# Patient Record
Sex: Female | Born: 1990 | Race: Black or African American | Hispanic: No | Marital: Single | Smoking: Current some day smoker
Health system: Southern US, Community
[De-identification: ages and names within clinical notes are randomized; demographics above are authoritative.]

## PROBLEM LIST (undated history)

## (undated) ENCOUNTER — Inpatient Hospital Stay (HOSPITAL_COMMUNITY): Payer: Self-pay

## (undated) DIAGNOSIS — F32A Depression, unspecified: Secondary | ICD-10-CM

## (undated) DIAGNOSIS — F329 Major depressive disorder, single episode, unspecified: Secondary | ICD-10-CM

## (undated) DIAGNOSIS — O24419 Gestational diabetes mellitus in pregnancy, unspecified control: Secondary | ICD-10-CM

## (undated) DIAGNOSIS — R51 Headache: Secondary | ICD-10-CM

## (undated) HISTORY — PX: NO PAST SURGERIES: SHX2092

---
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
## 2011-01-08 ENCOUNTER — Emergency Department (HOSPITAL_BASED_OUTPATIENT_CLINIC_OR_DEPARTMENT_OTHER)
Admission: EM | Admit: 2011-01-08 | Discharge: 2011-01-08 | Disposition: A | Discharge status: Home / Self Care | Payer: Self-pay | Attending: Emergency Medicine | Admitting: Emergency Medicine

## 2011-01-08 ENCOUNTER — Encounter: Payer: Self-pay | Admitting: Radiology

## 2011-01-08 ENCOUNTER — Emergency Department (INDEPENDENT_AMBULATORY_CARE_PROVIDER_SITE_OTHER): Payer: Self-pay

## 2011-01-08 DIAGNOSIS — T148XXA Other injury of unspecified body region, initial encounter: Secondary | ICD-10-CM | POA: Insufficient documentation

## 2011-01-08 DIAGNOSIS — Y92410 Unspecified street and highway as the place of occurrence of the external cause: Secondary | ICD-10-CM | POA: Insufficient documentation

## 2011-01-08 DIAGNOSIS — R0789 Other chest pain: Secondary | ICD-10-CM | POA: Insufficient documentation

## 2011-01-08 DIAGNOSIS — R0602 Shortness of breath: Secondary | ICD-10-CM | POA: Insufficient documentation

## 2011-01-08 DIAGNOSIS — T07XXXA Unspecified multiple injuries, initial encounter: Secondary | ICD-10-CM

## 2011-01-08 DIAGNOSIS — S21109A Unspecified open wound of unspecified front wall of thorax without penetration into thoracic cavity, initial encounter: Secondary | ICD-10-CM

## 2011-01-08 DIAGNOSIS — W269XXA Contact with unspecified sharp object(s), initial encounter: Secondary | ICD-10-CM

## 2011-01-08 MED ORDER — IBUPROFEN 800 MG PO TABS: 800.0000 mg | ORAL_TABLET | Freq: Three times a day (TID) | ORAL | Status: AC

## 2011-01-08 MED ORDER — IBUPROFEN 800 MG PO TABS
800.0000 mg | ORAL_TABLET | Freq: Once | ORAL | Status: AC
  Administered 2011-01-08: 800 mg via ORAL
  Filled 2011-01-08: qty 1
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
## 2013-08-17 ENCOUNTER — Inpatient Hospital Stay (HOSPITAL_COMMUNITY)
Admission: EM | Admit: 2013-08-17 | Discharge: 2013-08-22 | DRG: 781 | Disposition: A | Discharge status: Home / Self Care | Payer: Federal, State, Local not specified - Other | Source: Intra-hospital | Attending: Psychiatry | Admitting: Psychiatry

## 2013-08-17 ENCOUNTER — Inpatient Hospital Stay (HOSPITAL_COMMUNITY)
Admission: AD | Admit: 2013-08-17 | Discharge: 2013-08-17 | Disposition: A | Discharge status: Other Acute Inpatient Hospital | Payer: Medicaid Other | Source: Ambulatory Visit | Attending: Family Medicine | Admitting: Family Medicine

## 2013-08-17 ENCOUNTER — Encounter (HOSPITAL_COMMUNITY): Payer: Self-pay

## 2013-08-17 ENCOUNTER — Encounter (HOSPITAL_COMMUNITY): Payer: Self-pay | Admitting: *Deleted

## 2013-08-17 DIAGNOSIS — Z598 Other problems related to housing and economic circumstances: Secondary | ICD-10-CM

## 2013-08-17 DIAGNOSIS — F1290 Cannabis use, unspecified, uncomplicated: Secondary | ICD-10-CM

## 2013-08-17 DIAGNOSIS — Z5987 Material hardship due to limited financial resources, not elsewhere classified: Secondary | ICD-10-CM

## 2013-08-17 DIAGNOSIS — F329 Major depressive disorder, single episode, unspecified: Secondary | ICD-10-CM | POA: Diagnosis present

## 2013-08-17 DIAGNOSIS — Z681 Body mass index (BMI) 19 or less, adult: Secondary | ICD-10-CM

## 2013-08-17 DIAGNOSIS — O99340 Other mental disorders complicating pregnancy, unspecified trimester: Secondary | ICD-10-CM | POA: Insufficient documentation

## 2013-08-17 DIAGNOSIS — F1210 Cannabis abuse, uncomplicated: Secondary | ICD-10-CM | POA: Diagnosis present

## 2013-08-17 DIAGNOSIS — B9689 Other specified bacterial agents as the cause of diseases classified elsewhere: Secondary | ICD-10-CM | POA: Insufficient documentation

## 2013-08-17 DIAGNOSIS — A499 Bacterial infection, unspecified: Secondary | ICD-10-CM | POA: Insufficient documentation

## 2013-08-17 DIAGNOSIS — H53149 Visual discomfort, unspecified: Secondary | ICD-10-CM | POA: Insufficient documentation

## 2013-08-17 DIAGNOSIS — O2390 Unspecified genitourinary tract infection in pregnancy, unspecified trimester: Secondary | ICD-10-CM | POA: Insufficient documentation

## 2013-08-17 DIAGNOSIS — G43909 Migraine, unspecified, not intractable, without status migrainosus: Secondary | ICD-10-CM

## 2013-08-17 DIAGNOSIS — F17200 Nicotine dependence, unspecified, uncomplicated: Secondary | ICD-10-CM

## 2013-08-17 DIAGNOSIS — E860 Dehydration: Secondary | ICD-10-CM | POA: Insufficient documentation

## 2013-08-17 DIAGNOSIS — Z3490 Encounter for supervision of normal pregnancy, unspecified, unspecified trimester: Secondary | ICD-10-CM

## 2013-08-17 DIAGNOSIS — R45851 Suicidal ideations: Secondary | ICD-10-CM | POA: Insufficient documentation

## 2013-08-17 DIAGNOSIS — N760 Acute vaginitis: Secondary | ICD-10-CM | POA: Insufficient documentation

## 2013-08-17 DIAGNOSIS — F332 Major depressive disorder, recurrent severe without psychotic features: Secondary | ICD-10-CM | POA: Diagnosis present

## 2013-08-17 DIAGNOSIS — O99330 Smoking (tobacco) complicating pregnancy, unspecified trimester: Secondary | ICD-10-CM | POA: Insufficient documentation

## 2013-08-17 HISTORY — DX: Gestational diabetes mellitus in pregnancy, unspecified control: O24.419

## 2013-08-17 HISTORY — DX: Major depressive disorder, single episode, unspecified: F32.9

## 2013-08-17 HISTORY — DX: Depression, unspecified: F32.A

## 2013-08-17 HISTORY — DX: Headache: R51

## 2013-08-17 LAB — URINALYSIS, ROUTINE W REFLEX MICROSCOPIC
Bilirubin Urine: NEGATIVE
Glucose, UA: NEGATIVE mg/dL
HGB URINE DIPSTICK: NEGATIVE
Ketones, ur: 15 mg/dL — AB
Leukocytes, UA: NEGATIVE
NITRITE: NEGATIVE
PH: 7.5 (ref 5.0–8.0)
Protein, ur: NEGATIVE mg/dL
SPECIFIC GRAVITY, URINE: 1.015 (ref 1.005–1.030)
UROBILINOGEN UA: 0.2 mg/dL (ref 0.0–1.0)

## 2013-08-17 LAB — WET PREP, GENITAL
Trich, Wet Prep: NONE SEEN
Yeast Wet Prep HPF POC: NONE SEEN

## 2013-08-17 LAB — RAPID URINE DRUG SCREEN, HOSP PERFORMED
Amphetamines: NOT DETECTED
Barbiturates: NOT DETECTED
Benzodiazepines: NOT DETECTED
Cocaine: NOT DETECTED
Opiates: NOT DETECTED
Tetrahydrocannabinol: POSITIVE — AB

## 2013-08-17 LAB — OB RESULTS CONSOLE GC/CHLAMYDIA
Chlamydia: NEGATIVE
Gonorrhea: NEGATIVE

## 2013-08-17 MED ORDER — ACETAMINOPHEN 325 MG PO TABS
650.0000 mg | ORAL_TABLET | Freq: Four times a day (QID) | ORAL | Status: DC | PRN
  Administered 2013-08-19 (×2): 650 mg via ORAL
  Filled 2013-08-17 (×3): qty 2

## 2013-08-17 MED ORDER — OXYCODONE-ACETAMINOPHEN 5-325 MG PO TABS
1.0000 | ORAL_TABLET | Freq: Once | ORAL | Status: AC
  Administered 2013-08-17: 1 via ORAL
  Filled 2013-08-17: qty 1

## 2013-08-17 MED ORDER — MAGNESIUM HYDROXIDE 400 MG/5ML PO SUSP: 30.0000 mL | Freq: Every day | ORAL | Status: DC | PRN

## 2013-08-17 MED ORDER — PROMETHAZINE HCL 25 MG PO TABS
25.0000 mg | ORAL_TABLET | Freq: Once | ORAL | Status: AC
  Administered 2013-08-17: 25 mg via ORAL
  Filled 2013-08-17: qty 1

## 2013-08-17 MED ORDER — ALUM & MAG HYDROXIDE-SIMETH 200-200-20 MG/5ML PO SUSP
30.0000 mL | ORAL | Status: DC | PRN
  Administered 2013-08-20: 30 mL via ORAL

## 2013-08-17 NOTE — Progress Notes (Signed)
[facility] Group Notes:  (Nursing/MHT/Case Management/Adjunct)  Date:  08/17/2013  Time:  11:34 PM  Type of Therapy:  Group Therapy  Participation Level:  Did Not Attend  Participation Quality:  Did Not Attend  Affect:  Did Not Attend  Cognitive:  Did Not Attend  Insight:  None  Engagement in Group:  Did Not Attend  Modes of Intervention:  Socialization and Support  Summary of Progress/Problems: Pt. Was resting in bed.  Sondra ComeWilson, Kavish Lafitte J 08/17/2013, 11:34 PM
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
## 2013-08-17 NOTE — MAU Note (Signed)
[facility] arrived on campus to transport pt. Vital signs stable and pt ambulated to transport vehicle. [facility]. Awaiting call back.
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
## 2013-08-17 NOTE — Progress Notes (Signed)
D: Pt denies SI this evening. Pt presents with flat affect, depressed mood and brief eye contact. Pt observed in the bed lying down. Pt refused to attend wrap up group tonight. Pt repeatedly stated, "I'm fine, leave me alone, I just want to go to sleep". A: Medications reviewed by Clinical research associatewriter. Verbal support given. Pt encouraged to attend groups. 15 minute checks performed for safety. R: Pt guarded and forwards little information. Pt has minimal interaction on the milieu. Pt safety maintained.

## 2013-08-17 NOTE — Tx Team (Signed)
Initial Interdisciplinary Treatment Plan  PATIENT STRENGTHS: (choose at least two) Ability for insight Average or above average intelligence Capable of independent living General fund of knowledge  PATIENT STRESSORS: Financial difficulties Marital or family conflict   PROBLEM LIST: Problem List/Patient Goals Date to be addressed Date deferred Reason deferred Estimated date of resolution  Depression 08/17/13                                                      DISCHARGE CRITERIA:  Ability to meet basic life and health needs Improved stabilization in mood, thinking, and/or behavior Verbal commitment to aftercare and medication compliance  PRELIMINARY DISCHARGE PLAN: Attend aftercare/continuing care group Return to previous living arrangement  PATIENT/FAMIILY INVOLVEMENT: This treatment plan has been presented to and reviewed with the patient, Shelby Bailey, and/or family member, .  The patient and family have been given the opportunity to ask questions and make suggestions.  Hermela Hardt, [place] 08/17/2013, 7:23 PM

## 2013-08-17 NOTE — MAU Note (Signed)
Telepysch assessment completed. Pt cooperative and willing to answer questions. [facility] care of Dr. Dub MikesLugo. Will go to room 501-2. Consent signed and witnessed.

## 2013-08-17 NOTE — MAU Note (Signed)
[facility] called to transport patient 
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
## 2013-08-17 NOTE — MAU Note (Signed)
Also has felt dizzy when standing for a period of time

## 2013-08-17 NOTE — MAU Note (Signed)
SW called to see pt. Message left requesting consult

## 2013-08-18 ENCOUNTER — Encounter (HOSPITAL_COMMUNITY): Payer: Self-pay | Admitting: Psychiatry

## 2013-08-18 LAB — GC/CHLAMYDIA PROBE AMP
CT Probe RNA: NEGATIVE
GC PROBE AMP APTIMA: NEGATIVE

## 2013-08-18 NOTE — [facility] Suicide Risk Assessment (Signed)
Suicide Risk Assessment  Admission Assessment     Nursing information obtained from:    Demographic factors:    Current Mental Status:    Loss Factors:    Historical Factors:    Risk Reduction Factors:    Total Time spent with patient: 45 minutes  CLINICAL FACTORS:   Depression:   Hopelessness  Psychiatric Specialty Exam:     Blood pressure 105/73, pulse 118, temperature 99.1 F (37.3 C), temperature source Oral, resp. rate 21, height 5\' 6"  (1.676 m), weight 50.349 kg (111 lb), last menstrual period 05/03/2013.Body mass index is 17.92 kg/(m^2).  General Appearance: Fairly Groomed  Patent attorneyye Contact::  Fair  Speech:  Clear and Coherent  Volume:  fluctuates  Mood:  Anxious, Depressed, Irritable and worried  Affect:  anxious, worried  Thought Process:  Coherent and Goal Directed  Orientation:  Full (Time, Place, and Person)  Thought Content:  symtpoms, worries, concerns, not wanting to have this baby but at this stage of her pregnancy not able to have an abortion, not wanting to give the baby up for adoption  Suicidal Thoughts:  Yes.  without intent/plan  Homicidal Thoughts:  No  Memory:  Immediate;   Fair Recent;   Fair Remote;   Fair  Judgement:  Fair  Insight:  Present  Psychomotor Activity:  Restlessness  Concentration:  Fair  Recall:  FiservFair  Fund of Knowledge:Fair  Language: Fair  Akathisia:  No  Handed:    AIMS (if indicated):     Assets:  Resilience  Sleep:  Number of Hours: 6.25   Musculoskeletal: Strength & Muscle Tone: within normal limits Gait & Station: normal Patient leans: N/A  COGNITIVE FEATURES THAT CONTRIBUTE TO RISK:  Closed-mindedness Polarized thinking Thought constriction (tunnel vision)    SUICIDE RISK:   Moderate:  Frequent suicidal ideation with limited intensity, and duration, some specificity in terms of plans, no associated intent, good self-control, limited dysphoria/symptomatology, some risk factors present, and identifiable protective  factors, including available and accessible social support.  PLAN OF CARE: Supportive approach/coping skills                               CBT;mindfulness                                Reassess for need for antidepressants  I certify that inpatient services furnished can reasonably be expected to improve the patient's condition.  Clarann Helvey A 08/18/2013, 2:02 PM

## 2013-08-18 NOTE — Progress Notes (Signed)
Psychoeducational Group Note  Date: 08/18/2013 Time:  1015  Group Topic/Focus:  Identifying Needs:   The focus of this group is to help patients identify their personal needs that have been historically problematic and identify healthy behaviors to address their needs.  Participation Level:  Active  Participation Quality:  Appropriate  Affect:  Appropriate  Cognitive:  Oriented  Insight:  Improving  Engagement in Group:  Engaged  Additional Comments:  Attended and participated in the group  Wana Mount A  

## 2013-08-18 NOTE — Progress Notes (Signed)
D: Pt mood is depressed but she brightens upon approach. Eye contact is brief. She rates her depressed a 6/10. She states that her nausea from earlier today has improved.  A: Support given. Verbalization encouraged. Pt encouraged to come to staff with any concerns. Medications given as ordered. R: Pt is responsive. No complaints of pain or discomfort at this time. Q15 min safety checks maintained. Will continue to monitor pt.
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
## 2013-08-18 NOTE — [facility] Group Notes (Signed)
[facility] Group Notes: (Clinical Social Work)   08/18/2013      Type of Therapy:  Group Therapy   Participation Level:  Did Not Attend    Sanjith Siwek Grossman-Orr, LCSW 08/18/2013, 3:21 PM     

## 2013-08-18 NOTE — Progress Notes (Signed)
Adult Psychoeducational Group Note  Date:  08/18/2013 Time:  9:16 PM  Group Topic/Focus:  Wrap-Up Group:   The focus of this group is to help patients review their daily goal of treatment and discuss progress on daily workbooks.  Participation Level:  Active  Participation Quality:  Appropriate  Affect:  Appropriate  Cognitive:  Appropriate  Insight: Appropriate  Engagement in Group:  Engaged  Modes of Intervention:  Discussion  Additional Comments: The patient did not attended earlier groups.The patient was able to talk in talk base on other patient  comments.  Octavio Mannshigpen, Makaiah Terwilliger Lee 08/18/2013, 9:16 PM

## 2013-08-18 NOTE — Progress Notes (Signed)
.  Psychoeducational Group Note    Date: 08/18/2013 Time:  0930   Goal Setting Purpose of Group: To be able to set a goal that is measurable and that can be accomplished in one day Participation Level:  Active  Participation Quality:  Appropriate  Affect:  Appropriate  Cognitive:  Oriented  Insight:  Improving  Engagement in Group:  Improving  Additional Comments:    Yuritza Paulhus A  

## 2013-08-18 NOTE — Progress Notes (Signed)
[name] is seen OOB UAL on the 500 hall today...tolerated afiar. She is pleasant, she cooperates with her poc, she attends her groups and she is engaged in the group discussions. \  A She attends her groups, demonstrates minimal insight as evidenced by her  loud laughing and joking she has displayed both during groups and throughout the course of the day.   R Safety is in place and poc includes continuing to help pt process, examine her feelings and her  Behavior and begin to establish healthiecoping skills.

## 2013-08-19 DIAGNOSIS — F332 Major depressive disorder, recurrent severe without psychotic features: Secondary | ICD-10-CM

## 2013-08-19 NOTE — Progress Notes (Signed)
[facility] MD Progress Note  08/19/2013 10:22 AM Shelby Bailey  MRN:  [id number] Subjective:  "I'm feeling really good today.  Rates Depression at 2/10 Anxiety 1/10   Reports she sleep well and has a good appetie Denies suicidal thoughts-contracts for safety while at [facility] Still interested in speaking with SW regarding options to terminate pregancy Diagnosis:   DSM5: Depressive Disorders:  Major Depressive Disorder - Severe (296.23) Total Time spent with patient: 30 minutes  Axis I: Major Depression, Recurrent severe  ADL's:  Intact  Sleep: Good  Appetite:  Good  Suicidal Ideation:  Denies presently   Homicidal Ideation: Denies  AEB (as evidenced by):  Psychiatric Specialty Exam: Physical Exam  Constitutional: She is oriented to person, place, and time. She appears well-developed and well-nourished.  HENT:  Head: Normocephalic and atraumatic.  Neck: Normal range of motion. Neck supple.  Musculoskeletal: Normal range of motion.  Neurological: She is alert and oriented to person, place, and time.  Skin: Skin is warm and dry.    ROS  Blood pressure 113/74, pulse 121, temperature 98.5 F (36.9 C), temperature source Oral, resp. rate 16, height 5\' 6"  (1.676 m), weight 50.349 kg (111 lb), last menstrual period 05/03/2013.Body mass index is 17.92 kg/(m^2).  General Appearance: Casual  Eye Contact::  Good  Speech:  Clear and Coherent  Volume:  Normal  Mood:  Euthymic  Affect:  Appropriate  Thought Process:  Negative  Orientation:  Full (Time, Place, and Person)  Thought Content:  Negative  Suicidal Thoughts:  No  Homicidal Thoughts:  No  Memory:  Immediate;   Good Recent;   Good Remote;   Good  Judgement:  Fair  Insight:  Fair  Psychomotor Activity:  Normal  Concentration:  Good  Recall:  Good  Fund of Knowledge:Good  Language: Good  Akathisia:  Negative  Handed:  Right  AIMS (if indicated):     Assets:  Communication Skills Desire for Improvement Resilience  Sleep:   Number of Hours: 6.25   Musculoskeletal: Strength & Muscle Tone: within normal limits Gait & Station: normal Patient leans: N/A  Current Medications: Current Facility-Administered Medications  Medication Dose Route Frequency Provider Last Rate Last Dose  . acetaminophen (TYLENOL) tablet 650 mg  650 mg Oral Q6H PRN Rachael Fee, MD      . alum & mag hydroxide-simeth (MAALOX/MYLANTA) 200-200-20 MG/5ML suspension 30 mL  30 mL Oral Q4H PRN Rachael Fee, MD      . magnesium hydroxide (MILK OF MAGNESIA) suspension 30 mL  30 mL Oral Daily PRN Rachael Fee, MD        Lab Results:  Results for orders placed during the hospital encounter of 08/17/13 (from the past 48 hour(s))  WET PREP, GENITAL     Status: Abnormal   Collection Time    08/17/13  1:45 PM      Result Value Ref Range   Yeast Wet Prep HPF POC NONE SEEN  NONE SEEN   Trich, Wet Prep NONE SEEN  NONE SEEN   Clue Cells Wet Prep HPF POC FEW (*) NONE SEEN   WBC, Wet Prep HPF POC FEW (*) NONE SEEN   Comment: MANY BACTERIA SEEN  GC/CHLAMYDIA PROBE AMP     Status: None   Collection Time    08/17/13  1:45 PM      Result Value Ref Range   CT Probe RNA NEGATIVE  NEGATIVE   GC Probe RNA NEGATIVE  NEGATIVE   Comment: (NOTE)                                                                                               **  Normal Reference Range: Negative**          Assay performed using the Gen-Probe APTIMA COMBO2 (R) Assay.     Acceptable specimen types for this assay include APTIMA Swabs (Unisex,     endocervical, urethral, or vaginal), first void urine, and ThinPrep     liquid based cytology samples.     Performed at [facility]  URINALYSIS, ROUTINE W REFLEX MICROSCOPIC     Status: Abnormal   Collection Time    08/17/13  1:54 PM      Result Value Ref Range   Color, Urine YELLOW  YELLOW   APPearance HAZY (*) CLEAR   Specific Gravity, Urine 1.015  1.005 - 1.030   pH 7.5  5.0 - 8.0   Glucose, UA NEGATIVE  NEGATIVE mg/dL    Hgb urine dipstick NEGATIVE  NEGATIVE   Bilirubin Urine NEGATIVE  NEGATIVE   Ketones, ur 15 (*) NEGATIVE mg/dL   Protein, ur NEGATIVE  NEGATIVE mg/dL   Urobilinogen, UA 0.2  0.0 - 1.0 mg/dL   Nitrite NEGATIVE  NEGATIVE   Leukocytes, UA NEGATIVE  NEGATIVE   Comment: MICROSCOPIC NOT DONE ON URINES WITH NEGATIVE PROTEIN, BLOOD, LEUKOCYTES, NITRITE, OR GLUCOSE <1000 mg/dL.  URINE RAPID DRUG SCREEN (HOSP PERFORMED)     Status: Abnormal   Collection Time    08/17/13  1:54 PM      Result Value Ref Range   Opiates NONE DETECTED  NONE DETECTED   Cocaine NONE DETECTED  NONE DETECTED   Benzodiazepines NONE DETECTED  NONE DETECTED   Amphetamines NONE DETECTED  NONE DETECTED   Tetrahydrocannabinol POSITIVE (*) NONE DETECTED   Barbiturates NONE DETECTED  NONE DETECTED   Comment:            DRUG SCREEN FOR MEDICAL PURPOSES     ONLY.  IF CONFIRMATION IS NEEDED     FOR ANY PURPOSE, NOTIFY LAB     WITHIN 5 DAYS.                LOWEST DETECTABLE LIMITS     FOR URINE DRUG SCREEN     Drug Class       Cutoff (ng/mL)     Amphetamine      1000     Barbiturate      200     Benzodiazepine   200     Tricyclics       300     Opiates          300     Cocaine          300     THC              50     Performed at [facility]    Physical Findings: AIMS: Facial and Oral Movements Muscles of Facial Expression: None, normal Lips and Perioral Area: None, normal Jaw: None, normal Tongue: None, normal,Extremity Movements Upper (arms, wrists, hands, fingers): None, normal Lower (legs, knees, ankles, toes): None, normal, Trunk Movements Neck, shoulders, hips: None, normal, Overall Severity Severity of abnormal movements (highest score from questions above): None, normal Incapacitation due to abnormal movements: None, normal Patient's awareness of abnormal movements (rate only patient's report): No Awareness, Dental Status Current problems with teeth and/or dentures?: No Does patient usually wear  dentures?: No  CIWA:    COWS:     Treatment Plan Summary: Daily contact with patient to assess and evaluate symptoms and progress in treatment Medication management  Review of chart, vital signs, medications and notes  Daily contact with the patient to assess and evaluate synmptoms and progress in treatment  1. Continue crisis management and stabilization. Estimated length of stay 5-7 days 2.  No psych meds prescribed at this time secondary to pregancy      Individual and group therapy encouraged     Coping skills for depression, substance abuse, and anxiety 3.  Treat health problems as indicated. 4.  Develop treatment plan to decrease risk of relapse upon discharge and the need for readmission 5.  Psych-social education regarding relapse prevention and self care.     Discussed self responsibility in healthcare and choices and consequences  6.  Health care follow up as needed for medical problems 7.  Continue home medications where appropriate 8.  Disposition in progress  Plan:  Medical Decision Making Problem Points:  Established problem, stable/improving (1) and Review of psycho-social stressors (1) Data Points:  Review of medication regiment & side effects (2)  I certify that inpatient services furnished can reasonably be expected to improve the patient's condition.   Lorinda CreedLARACH, MARY PMHNP 08/19/2013, 10:22 AM Agree with assessment and plan Madie RenoIrving A. Dub MikesLugo, M.D.

## 2013-08-19 NOTE — Progress Notes (Signed)
Adult Psychoeducational Group Note  Date:  08/19/2013 Time:  3:05PM  Group Topic/Focus:  Making Healthy Choices:   The focus of this group is to help patients identify negative/unhealthy choices they were using prior to admission and identify positive/healthier coping strategies to replace them upon discharge.  Participation Level:  Did Not Attend  Additional Comments:  Pt did not attend group. Pt was in her room during group time. When informed that it was group time, pt acknowledged that and continued to stay in her room   Marrietta Thunder K 08/19/2013, 3:15 PM

## 2013-08-19 NOTE — Progress Notes (Signed)
Psychoeducational Group Note  Date: 08/19/2013 Time:  0930  Group Topic/Focus:  Gratefulness:  The focus of this group is to help patients identify what two things they are most grateful for in their lives. What helps ground them and to center them on their work to their recovery.  Participation Level:  Active  Participation Quality:  Appropriate  Affect:  Appropriate  Cognitive:  Oriented  Insight:  Improving  Engagement in Group:  Engaged  Additional Comments:  Participated fully in the group  Malessa Zartman A   

## 2013-08-19 NOTE — Progress Notes (Signed)
Psychoeducational Group Note  Date:  08/19/2013 Time:  1015  Group Topic/Focus:  Making Healthy Choices:   The focus of this group is to help patients identify negative/unhealthy choices they were using prior to admission and identify positive/healthier coping strategies to replace them upon discharge.  Participation Level:  Active  Participation Quality:  Appropriate  Affect:  appropriate Cognitive:  Oriented  Insight:  Improving  Engagement in Group:  Engaged  Additional Comments:   Teshara Moree A 08/19/2013  

## 2013-08-19 NOTE — Progress Notes (Signed)
Adult Psychoeducational Group Note  Date:  08/19/2013 Time:  11:06 PM  Group Topic/Focus:  Wrap-Up Group:   The focus of this group is to help patients review their daily goal of treatment and discuss progress on daily workbooks.  Participation Level:  Did Not Attend  Participation Quality:  did not attend  Affect:  Did not attend  Cognitive:  did nto attend  Insight: None  Engagement in Group:  None  Modes of Intervention:  none  Additional Comments:  Pt did not attend group  Jola Baptistbdin, Lott Seelbach 08/19/2013, 11:06 PM

## 2013-08-19 NOTE — Progress Notes (Signed)
[name] has had a hard day today...staying in her bed most of the afternoon and resting in her bed. She is pleasant and cooperative and mu8ch less visibly depressed  Today.   A  she has been observed  Throwing up in her trash can twice and says " I don't know what happens.Marland Kitchen. it  just comes over me". Aftershe vomited she rested in her bed and both times said the feeling of nausea has abated and she can tolerate po food and fluids.   R Safety is in place and poc maintaiend.

## 2013-08-19 NOTE — [facility] Group Notes (Signed)
[facility] Group Notes: (Clinical Social Work)   08/19/2013      Type of Therapy:  Group Therapy   Participation Level:  Did Not Attend    Ambrose MantleMareida Grossman-Orr, LCSW 08/19/2013, 3:16 PM

## 2013-08-20 MED ORDER — PRENATAL MULTIVITAMIN CH
1.0000 | ORAL_TABLET | Freq: Every day | ORAL | Status: DC
  Administered 2013-08-21 – 2013-08-22 (×2): 1 via ORAL
  Filled 2013-08-20 (×3): qty 1

## 2013-08-20 NOTE — Clinical Social Work Note (Signed)
CSW attempted to meet with pt individually at this time.  Pt was focused on wanting to d/c today and didn't understand why she can't d/c today.  Pt's voice is raised and pt is irritable with CSW.  CSW explained that pt would need to be assessed by the team and once deemed stable she could d/c.  Pt states that she was already assessed over the weekend and told she would only be here 3-5 days, exactly.  CSW attempted to discuss d/c plans.  Pt states that she will return to her [place] and is open to outpatient referrals, but won't talk about it until she is walking out the door.    Reyes IvanChelsea Horton, LCSW 08/20/2013  11:55 AM
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
## 2013-08-20 NOTE — MAU Provider Note (Signed)
Attestation of Attending Supervision of Advanced Practitioner (PA/CNM/NP): Evaluation and management procedures were performed by the Advanced Practitioner under my supervision and collaboration.  I have reviewed the Advanced Practitioner's note and chart, and I agree with the management and plan.  Reva BoresPRATT,Erminio Nygard S, MD [facility] Attending 08/20/2013 11:26 AM
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
## 2013-08-20 NOTE — [facility] Group Notes (Signed)
[facility]   08/20/2013  8:45 AM  Participation Quality:  Did Not Attend - pt sleeping in her room  Reyes IvanChelsea Horton, LCSW 08/20/2013 10:06 AM

## 2013-08-20 NOTE — Progress Notes (Signed)
Patient ID: Shelby Bailey, female   DOB: 06-10-1991, 23 y.o.   MRN: [id number] [facility] MD Progress Note  08/20/2013 6:30 PM Shelby Bailey  MRN:  [id number] Subjective:  Pt began today's assessment with a positive attitude and disposition. However, when pt was informed that she was not yet ready to be discharged, pt became very irate and started shouting and cursing at this NP, other providers, and the treatment team. Pt was shouting expletives intermittently during this assessment with brief periods of listening to treatment team input. Pt was informed that this type of outburst and aggressive behavior was further evidence that she was not ready to be discharged and was not stable enough at this time. Pt became more irate about this discussion. Pt denies SI, HI, and AVH, contracts for safety, yet minimizes anxiety/depression symptoms stating that she "is fine, I'm fine". Pt is crying profusely at many points during this interaction, especially when talking about her current son, her current pregnancy, her relationship with her mother, and her suicide attempt to drown herself in the bathtub. Pt later apologized to members of the treatment team and stated that her outburst was proof that she really does need help. Will continue to monitor.   Diagnosis:   DSM5: Depressive Disorders:  Major Depressive Disorder - Severe (296.23) Total Time spent with patient: 30 minutes  Axis I: Generalized Anxiety Disorder and Major Depression, Recurrent severe Axis II: Deferred Axis III:  Past Medical History  Diagnosis Date  . Headache(784.0)   . Gestational diabetes     Diet controlled  . Depression    Axis IV: economic problems, educational problems, housing problems, occupational problems, other psychosocial or environmental problems and problems related to social environment Axis V: 41-50 serious symptoms  ADL's:  Intact  Sleep: Good  Appetite:  Poor  Suicidal Ideation:  Denies presently   Homicidal  Ideation: Denies  AEB (as evidenced by):  Psychiatric Specialty Exam: Physical Exam  Constitutional: She is oriented to person, place, and time. She appears well-developed and well-nourished.  HENT:  Head: Normocephalic and atraumatic.  Neck: Normal range of motion. Neck supple.  Musculoskeletal: Normal range of motion.  Neurological: She is alert and oriented to person, place, and time.  Skin: Skin is warm and dry.    Review of Systems  Constitutional: Negative.   HENT: Negative.   Eyes: Negative.   Respiratory: Negative.   Cardiovascular: Negative.   Gastrointestinal: Negative.   Genitourinary: Negative.   Musculoskeletal: Negative.   Skin: Negative.   Neurological: Negative.   Endo/Heme/Allergies: Negative.   Psychiatric/Behavioral: Positive for depression. Negative for suicidal ideas and hallucinations. The patient is nervous/anxious. The patient does not have insomnia.     Blood pressure 98/73, pulse 89, temperature 97.9 F (36.6 C), temperature source Oral, resp. rate 16, height 5\' 6"  (1.676 m), weight 50.349 kg (111 lb), last menstrual period 05/03/2013.Body mass index is 17.92 kg/(m^2).  General Appearance: Casual and Guarded  Eye Contact::  Good  Speech:  Clear and Coherent  Volume:  Increased  Mood:  Angry, Anxious, Depressed, Hopeless and Irritable  Affect:  Appropriate  Thought Process:  Negative  Orientation:  Full (Time, Place, and Person)  Thought Content:  Negative  Suicidal Thoughts:  No  Homicidal Thoughts:  No  Memory:  Immediate;   Good Recent;   Good Remote;   Good  Judgement:  Fair  Insight:  Fair  Psychomotor Activity:  Normal  Concentration:  Good  Recall:  Good  Fund of Knowledge:Good  Language: Good  Akathisia:  Negative  Handed:  Right  AIMS (if indicated):     Assets:  Communication Skills Desire for Improvement Resilience  Sleep:  Number of Hours: 5.75   Musculoskeletal: Strength & Muscle Tone: within normal limits Gait &  Station: normal Patient leans: N/A  Current Medications: Current Facility-Administered Medications  Medication Dose Route Frequency Provider Last Rate Last Dose  . acetaminophen (TYLENOL) tablet 650 mg  650 mg Oral Q6H PRN Rachael FeeIrving A Lugo, MD   650 mg at 08/19/13 2347  . alum & mag hydroxide-simeth (MAALOX/MYLANTA) 200-200-20 MG/5ML suspension 30 mL  30 mL Oral Q4H PRN Rachael FeeIrving A Lugo, MD   30 mL at 08/20/13 0744  . magnesium hydroxide (MILK OF MAGNESIA) suspension 30 mL  30 mL Oral Daily PRN Rachael FeeIrving A Lugo, MD      . Melene Muller[START ON 08/21/2013] prenatal multivitamin tablet 1 tablet  1 tablet Oral Q1200 Nehemiah SettleJanardhaha R Javohn Basey, MD        Lab Results:  No results found for this or any previous visit (from the past 48 hour(s)).  Physical Findings: AIMS: Facial and Oral Movements Muscles of Facial Expression: None, normal Lips and Perioral Area: None, normal Jaw: None, normal Tongue: None, normal,Extremity Movements Upper (arms, wrists, hands, fingers): None, normal Lower (legs, knees, ankles, toes): None, normal, Trunk Movements Neck, shoulders, hips: None, normal, Overall Severity Severity of abnormal movements (highest score from questions above): None, normal Incapacitation due to abnormal movements: None, normal Patient's awareness of abnormal movements (rate only patient's report): No Awareness, Dental Status Current problems with teeth and/or dentures?: No Does patient usually wear dentures?: No  CIWA:    COWS:     Treatment Plan Summary: Daily contact with patient to assess and evaluate symptoms and progress in treatment Medication management  Review of chart, vital signs, medications and notes  Daily contact with the patient to assess and evaluate synmptoms and progress in treatment  1. Continue crisis management and stabilization. Estimated length of stay 5-7 days 2.  No psych meds prescribed at this time secondary to pregancy      Individual and group therapy encouraged      Coping skills for depression, substance abuse, and anxiety 3.  Treat health problems as indicated. 4.  Develop treatment plan to decrease risk of relapse upon discharge and the need for readmission 5.  Psych-social education regarding relapse prevention and self care.     Discussed self responsibility in healthcare and choices and consequences  6.  Health care follow up as needed for medical problems 7.  Continue home medications where appropriate 8.  Disposition in progress  Plan:  Medical Decision Making Problem Points:  Established problem, worsening (2), Review of last therapy session (1) and Review of psycho-social stressors (1) Data Points:  Review of medication regiment & side effects (2)  I certify that inpatient services furnished can reasonably be expected to improve the patient's condition.   Beau FannyWithrow, John C, FNP-BC 08/20/2013, 6:30 PM  Reviewed the information documented and agree with the treatment plan.  Alden Feagan,JANARDHAHA R. 08/21/2013 2:54 PM

## 2013-08-20 NOTE — Tx Team (Signed)
Interdisciplinary Treatment Plan Update (Adult)  Date: 08/20/2013  Time Reviewed:  9:45 AM  Progress in Treatment: Attending groups: Yes Participating in groups:  Yes Taking medication as prescribed:  Yes Tolerating medication:  Yes Family/Significant othe contact made: CSW assessing  Patient understands diagnosis:  Yes Discussing patient identified problems/goals with staff:  Yes Medical problems stabilized or resolved:  Yes Denies suicidal/homicidal ideation: Yes Issues/concerns per patient self-inventory:  Yes Other:  New problem(s) identified: N/A  Discharge Plan or Barriers: CSW assessing for appropriate referrals.  Reason for Continuation of Hospitalization: Anxiety Depression Medication Stabilization  Comments: N/A  Estimated length of stay: 3-5 days  For review of initial/current patient goals, please see plan of care.  Attendees: Patient:     Family:     Physician:  Dr. Johnalagadda 08/20/2013 10:32 AM   Nursing:   Beverly Knight, RN 08/20/2013 10:32 AM   Clinical Social Worker:  Nelton Amsden Horton, LCSW 08/20/2013 10:32 AM   Other: Conrad Withrow, PA 08/20/2013 10:32 AM   Other:  Valerie Noch, care coordination 08/20/2013 10:32 AM   Other:  Quylle Hodnett, LCSW 08/20/2013 10:32 AM   Other:  Carol Davis, RN 08/20/2013 10:32 AM   Other: Andrea Thorne, RN 08/20/2013 10:32 AM   Other:    Other:    Other:    Other:    Other:     Scribe for Treatment Team:   Horton, Elhadj Girton Nicole, 08/20/2013 10:32 AM      

## 2013-08-20 NOTE — Progress Notes (Signed)
Pt has been in bed all evening sleeping on and off. Summoned this RN due to complaints of pelvic cramping which she rates an 8/10. Denies discharge of any kind, urinary pain or problems and denies constipation as she states she hasn't been able to eat very much. Tylenol prn given for discomfort. Pt provided ginger-ale and ice water along with crackers and goldfish. Encouraged to stay hydrated even if food is hard to tolerate currently. Pt verbalized understanding. Will reassess pain in 1 hour. Pt denies SI/HI/AVH though is quite flat, depressed and hopeless. Pt safely resting in bed at this time. Lawrence MarseillesFriedman, Lathan Gieselman Eakes

## 2013-08-20 NOTE — Progress Notes (Signed)
Pt did not attend group due to her emotional state at the time. She was crying and said she did not feel like attending group.

## 2013-08-20 NOTE — Progress Notes (Signed)
Patient ID: Shelby Bailey, female   DOB: Aug 14, 1990, 23 y.o.   MRN: [id number] D: pt. In bed this evening reports "I stay to myself not that I'm anti or anything it's just that my heart go to racing when I'm out there" Pt. Has sub in room with her "I'm keeping it safe and sound" A: Staff will monitor q5215min for safety. R: Pt. Is safe on the unit, refuses group.

## 2013-08-21 DIAGNOSIS — F411 Generalized anxiety disorder: Secondary | ICD-10-CM

## 2013-08-21 NOTE — Progress Notes (Signed)
D: pt is minimal on approach and forwards little. Pt has stayed in room most of evening. Denies si/hi/avh. Denies pain. Pt wanting clothing washed and ginger ale for nausea. Pt is calm and appropriate to the situation A: ginger ale given, clothes being washed. q 15 min safety checks R: pt remains safe on unit. No further complaints at this time

## 2013-08-21 NOTE — [facility] Group Notes (Signed)
Adult Psychoeducational Group Note  Date:  08/21/2013 Time: 0900am  Group Topic/Focus:  Orientation:   The focus of this group is to educate the patient on the purpose and policies of crisis stabilization and provide a format to answer questions about their admission.  The group details unit policies and expectations of patients while admitted.  Participation Level:  Did Not Attend  Alfonse Sprucehorne, Quin Mcpherson Brooke 08/21/2013, 1:11 PM

## 2013-08-21 NOTE — Progress Notes (Signed)
Recreation Therapy Notes  Date: 02.23.2015 Time: 2:45pm Location: [address] Day[address]   Group Topic: Wellness  Goal Area(s) Addresses:  Patient will define components of whole wellness. Patient will verbalize benefit of whole wellness.  Behavioral Response: Did not attend.   Adithya Difrancesco L Taha Dimond, LRT/CTRS  Radie Berges L 08/21/2013 9:31 AM 

## 2013-08-21 NOTE — Progress Notes (Signed)
[facility]  08/21/2013 11:04 AM Shelby Bailey  MRN:  [id number] Subjective:   Patient seen chart reviewed.  Patient remains very labile and intrusive however there were no aggression or combative behavior.  She continues to have irritability and mood swings.  She admitted having passive homicidal thoughts towards her baby because she does not want pregnancy .  Patient is learning slowly to deal with her emotions .  She is going to the groups.  She admitted having plan to kill the baby by going research on line and thinking about taking overdose on vitamins because she could not afford abortion.  She minimizes her symptoms of irritability and anger.  She denies any suicidal thoughts at this time but remains very labile, grandiose, irritable and emotional.  She denies any hallucinations or any paranoia.  Diagnosis:   DSM5: Depressive Disorders:  Major Depressive Disorder - Severe (296.23) Total Time spent with patient: 30 minutes  Axis I: Generalized Anxiety Disorder and Major Depression, Recurrent severe Axis II: Deferred Axis III:  Past Medical History  Diagnosis Date  . Headache(784.0)   . Gestational diabetes     Diet controlled  . Depression    Axis IV: economic problems, educational problems, housing problems, occupational problems, other psychosocial or environmental problems and problems related to social environment Axis V: 41-50 serious symptoms  ADL's:  Intact  Sleep: Good  Appetite:  Poor  Suicidal Ideation:  Denies presently   Homicidal Ideation: Denies  AEB (as evidenced by):  Psychiatric Specialty Exam: Physical Exam  Constitutional: She is oriented to person, place, and time. She appears well-developed and well-nourished.  HENT:  Head: Normocephalic and atraumatic.  Neck: Normal range of motion. Neck supple.  Musculoskeletal: Normal range of motion.  Neurological: She is alert and oriented to person, place, and time.  Skin: Skin is warm and dry.     Review of Systems  Constitutional: Negative.   HENT: Negative.   Eyes: Negative.   Respiratory: Negative.   Cardiovascular: Negative.   Gastrointestinal: Negative.   Genitourinary: Negative.   Musculoskeletal: Negative.   Skin: Negative.   Neurological: Negative.   Endo/Heme/Allergies: Negative.   Psychiatric/Behavioral: Positive for depression. Negative for suicidal ideas and hallucinations. The patient is nervous/anxious. The patient does not have insomnia.     Blood pressure 121/80, pulse 113, temperature 98.4 F (36.9 C), temperature source Oral, resp. rate 18, height 5\' 6"  (1.676 m), weight 50.349 kg (111 lb), last menstrual period 05/03/2013.Body mass index is 17.92 kg/(m^2).  General Appearance: Casual and Guarded  Eye Contact::  Good  Speech:  Clear and Coherent  Volume:  Increased  Mood:  Anxious, Depressed and Irritable  Affect:  Appropriate  Thought Process:  Negative  Orientation:  Full (Time, Place, and Person)  Thought Content:  Negative  Suicidal Thoughts:  No  Homicidal Thoughts:  No  Memory:  Immediate;   Good Recent;   Good Remote;   Good  Judgement:  Fair  Insight:  Fair  Psychomotor Activity:  Normal  Concentration:  Good  Recall:  Good  Fund of Knowledge:Good  Language: Good  Akathisia:  Negative  Handed:  Right  AIMS (if indicated):     Assets:  Communication Skills Desire for Improvement Resilience  Sleep:  Number of Hours: 5   Musculoskeletal: Strength & Muscle Tone: within normal limits Gait & Station: normal Patient leans: N/A  Current Medications: Current Facility-Administered Medications  Medication Dose Route Frequency Provider Last Rate Last Dose  . acetaminophen (  TYLENOL) tablet 650 mg  650 mg Oral Q6H PRN Rachael Fee, MD   650 mg at 08/19/13 2347  . alum & mag hydroxide-simeth (MAALOX/MYLANTA) 200-200-20 MG/5ML suspension 30 mL  30 mL Oral Q4H PRN Rachael Fee, MD   30 mL at 08/20/13 0744  . magnesium hydroxide (MILK OF  MAGNESIA) suspension 30 mL  30 mL Oral Daily PRN Rachael Fee, MD      . prenatal multivitamin tablet 1 tablet  1 tablet Oral Q1200 Nehemiah Settle, MD        Lab Results:  No results found for this or any previous visit (from the past 48 hour(s)).  Physical Findings: AIMS: Facial and Oral Movements Muscles of Facial Expression: None, normal Lips and Perioral Area: None, normal Jaw: None, normal Tongue: None, normal,Extremity Movements Upper (arms, wrists, hands, fingers): None, normal Lower (legs, knees, ankles, toes): None, normal, Trunk Movements Neck, shoulders, hips: None, normal, Overall Severity Severity of abnormal movements (highest score from questions above): None, normal Incapacitation due to abnormal movements: None, normal Patient's awareness of abnormal movements (rate only patient's report): No Awareness, Dental Status Current problems with teeth and/or dentures?: No Does patient usually wear dentures?: No  CIWA:    COWS:     Treatment Plan Summary: Daily contact with patient to assess and evaluate symptoms and progress in treatment Medication management  Review of chart, vital signs, medications and notes  Daily contact with the patient to assess and evaluate synmptoms and progress in treatment  1. Continue crisis management and stabilization. Estimated length of stay 4-6 days 2.  No psych meds prescribed at this time secondary to pregancy      Individual and group therapy encouraged     Coping skills for depression, substance abuse, and anxiety 3.  Treat health problems as indicated. 4.  Develop treatment plan to decrease risk of relapse upon discharge and the need for readmission 5.  Psych-social education regarding relapse prevention and self care.     Discussed self responsibility in healthcare and choices and consequences  6.  Health care follow up as needed for medical problems 7.  Continue home medications where appropriate 8.  Disposition in  progress  Plan:  Medical Decision Making Problem Points:  Established problem, worsening (2), Review of last therapy session (1) and Review of psycho-social stressors (1) Data Points:  Review of medication regiment & side effects (2)  I certify that inpatient services furnished can reasonably be expected to improve the patient's condition.   Shakeitha Umbaugh T. 08/21/2013, 11:04 AM

## 2013-08-21 NOTE — [facility] Suicide Risk Assessment (Signed)
[facility] INPATIENT:  Family/Significant Other Suicide Prevention Education  Suicide Prevention Education:  Education Completed; Lorriane Shireyree Hodge - boyfriend [phone]),  (name of family member/significant other) has been identified by the patient as the family member/significant other with whom the patient will be residing, and identified as the person(s) who will aid the patient in the event of a mental health crisis (suicidal ideations/suicide attempt).  With written consent from the patient, the family member/significant other has been provided the following suicide prevention education, prior to the and/or following the discharge of the patient.  The suicide prevention education provided includes the following:  Suicide risk factors  Suicide prevention and interventions  National Suicide Hotline telephone number  [facility] assessment telephone number  [facility] Emergency Assistance 911  [facility] and/or Residential Mobile Crisis Unit telephone number  Request made of family/significant other to:  Remove weapons (e.g., guns, rifles, knives), all items previously/currently identified as safety concern.    Remove drugs/medications (over-the-counter, prescriptions, illicit drugs), all items previously/currently identified as a safety concern.  The family member/significant other verbalizes understanding of the suicide prevention education information provided.  The family member/significant other agrees to remove the items of safety concern listed above.  Carmina MillerHorton, Antavius Sperbeck Nicole 08/21/2013, 2:33 PM

## 2013-08-21 NOTE — Progress Notes (Signed)
D:  Per pt self inventory pt reports sleeping well, appetite poor, energy level high, ability to pay attention good, rates depression at a 1 out of 10 and hopelessness at a 1 out of 10, denies SI/HI/AVH.    A:  Emotional support provided, Encouraged pt to continue with treatment plan and attend all group activities, q15 min checks maintained for safety.  R:  Pt is not attending groups, is not vested in treatment and does not feel as though she should be here, isolates in room often and needs much encouragement to come out.

## 2013-08-21 NOTE — [facility] Group Notes (Signed)
[facility] LCSW Group Therapy  08/21/2013  1:15 PM   Type of Therapy:  Group Therapy  Participation Level:  Active  Participation Quality:  Appropriate, Attentive, Sharing and Supportive  Affect: Calm  Cognitive:  Alert and Oriented  Insight:  Developing/Improving and Engaged  Engagement in Therapy:  Developing/Improving and Engaged  Modes of Intervention:  Activity, Clarification, Confrontation, Discussion, Education, Exploration, Limit-setting, Orientation, Problem-solving, Rapport Building, Dance movement psychotherapisteality Testing, Socialization and Support  Summary of Progress/Problems: Patient was attentive and engaged with speaker from [employer].  Patient was attentive to speaker while they shared their story of dealing with mental health and overcoming it.  Patient expressed interest in their programs and services and received information on their agency.  Patient processed ways they can relate to the speaker.     Reyes IvanChelsea Horton, LCSW 08/21/2013 1:53 PM

## 2013-08-21 NOTE — Clinical Social Work Note (Signed)
CSW spoke with pt individually at this time.  CSW notes that pt is very calm and logical today.  Pt explained that she was very agitated yesterday due to weekend staff telling her she could leave on Monday and then it not happening.  Pt states that being here she has been able to clear her mind and think more and has decided to keep her baby.  Pt explained that she was thinking that she would be unstable and have a hard time caring for another child but now realizes that she loves her 22 year old son and wouldn't want to change anything with him, so will accept her unborn baby and know that she will figure things out, one way or another.  Pt is open to referral to [facility] [place] now for [place] treatment.  No further needs voiced by pt at this time.    Shelby IvanChelsea Horton, LCSW 08/21/2013  2:14 PM

## 2013-08-22 NOTE — [facility] Group Notes (Signed)
[facility] LCSW Group Therapy  08/22/2013 1:15 PM   Type of Therapy:  Group Therapy  Participation Level:  Did Not Attend - pt came during the last 5 minutes of group  Shelby IvanChelsea Horton, LCSW 08/22/2013 2:47 PM

## 2013-08-22 NOTE — Progress Notes (Signed)
[facility] Group Notes:  (Nursing/MHT/Case Management/Adjunct)  Date:  08/22/2013  Time:  12:23 AM  Type of Therapy:  Group Therapy  Participation Level:  Minimal  Participation Quality:  Attentive and Intrusive  Affect:  Appropriate and Blunted  Cognitive:  Appropriate  Insight:  Limited  Engagement in Group:  Developing/Improving  Modes of Intervention:  Socialization and Support  Summary of Progress/Problems: Pt attended and had limited insight on group topic of recovery.  Pt. Walked out of group.  Sondra ComeWilson, [place] Vierling J 08/22/2013, 12:23 AM

## 2013-08-22 NOTE — Progress Notes (Signed)
Adult Psychoeducational Group Note  Date:  08/22/2013 Time:  10:00am Group Topic/Focus:  Personal Choices and Values:   The focus of this group is to help patients assess and explore the importance of values in their lives, how their values affect their decisions, how they express their values and what opposes their expression.  Participation Level:  Active  Participation Quality:  Appropriate and Attentive  Affect:  Appropriate  Cognitive:  Alert and Appropriate  Insight: Appropriate  Engagement in Group:  Engaged  Modes of Intervention:  Discussion and Education  Additional Comments:  Pt attended and participated in group. Discussion was on personal development. Pt was asked what does personal development mean to you? Pt stated personal development means growing and learning yourself.  Shelly BombardGarner, Pio Eatherly D 08/22/2013, 2:03 PM

## 2013-08-22 NOTE — Progress Notes (Signed)
Recreation Therapy Notes  Date: 02.24.2015 Time: 2:45pm Location: [address] Dayroom   Group Topic: Communication, Team Building, Problem Solving  Goal Area(s) Addresses:  Patient will effectively work with peer towards shared goal.  Patient will identify skill used to make activity successful.  Patient will identify how skills used during activity can be used to reach post d/c goals.   Behavioral Response: Did not attend.    Marykay Lexenise L Nickolas Chalfin, LRT/CTRS  Sharonne Ricketts L 08/22/2013 1:18 PM
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
## 2013-08-22 NOTE — Progress Notes (Signed)
Patient ID: Shelby Bailey, female   DOB: 1990/12/30, 23 y.o.   MRN: [id number]  Pt. Denies SI/HI and A/V hallucinations. Patient was given her multivitamin today. No PRN medication was given. Patient did not turn in her daily inventory sheet today. Belongings returned to patient at time of discharge. Patient denies any pain or discomfort. Discharge instructions and medications were reviewed with patient. Patient verbalized understanding of both medications and discharge instructions. Q15 minute safety checks were maintained until discharged. No distress noted upon discharge.

## 2013-08-22 NOTE — Progress Notes (Signed)
[facility] :  Will you be returning to the same living situation after discharge: Yes,  returning home At discharge, do you have transportation home?:Yes,  reports boyfriend will pick her up today Do you have the ability to pay for your medications:Yes,  provided pt with samples and prescriptions and [facility] with [facility] meds  Release of information consent forms completed and in the chart;  Patient's signature needed at discharge.  Patient to Follow up at: Follow-up Information   Follow up with [facility] On 08/24/2013. (Walk in on this date for hospital discharge appointment.  Walk in clinic is Monday - Friday 8 am - 3 pm.  They will than schedule you for medication management and therapy.  )    Contact information:   [address] [place], [place] [address] Phone: [phone] Fax: [phone]      Patient denies SI/HI:   Yes,  denies SI/HI    Safety Planning and Suicide Prevention discussed:  Yes,  discussed with pt and pt's boyfriend.  See suicide prevention education note.   Carmina MillerHorton, Diania Co Nicole 08/22/2013, 10:30 AM

## 2013-08-22 NOTE — [facility] Group Notes (Signed)
[facility]   08/22/2013 8:45 AM  Participation Quality:  Alert, Appropriate and Oriented  Mood/Affect:  Calm  Depression Rating: 0    Anxiety Rating:  0  Thoughts of Suicide:  Pt denies SI/HI  Will you contract for safety?   Yes  Current AVH:  Pt denies  Plan for Discharge/Comments:  Pt attended discharge planning group and actively participated in group.  CSW provided pt with today's workbook.  Pt reports feeling well today and is ready to d/c.  [place] and [facility] medication management and therapy.  No further needs voiced by pt at this time.    Transportation Means: Pt reports access to transportation - reports boyfriend will pick her up   Supports: No supports mentioned at this time  Shelby IvanChelsea Horton, LCSW 08/22/2013 10:16 AM

## 2013-08-22 NOTE — Tx Team (Signed)
Interdisciplinary Treatment Plan Update (Adult)  Date: 08/22/2013  Time Reviewed:  9:45 AM  Progress in Treatment: Attending groups: Yes Participating in groups:  Yes Taking medication as prescribed:  Yes Tolerating medication:  Yes Family/Significant othe contact made: Yes, with pt's boyfriend Patient understands diagnosis:  Yes Discussing patient identified problems/goals with staff:  Yes Medical problems stabilized or resolved:  Yes Denies suicidal/homicidal ideation: Yes Issues/concerns per patient self-inventory:  Yes Other:  New problem(s) identified: N/A  Discharge Plan or Barriers: [facility] and therapy.    Reason for Continuation of Hospitalization: Stable to d/c today  Comments: N/A  Estimated length of stay: D/C today  For review of initial/current patient goals, please see plan of care.  Attendees: Patient:  Shelby Bailey  08/22/2013 10:27 AM   Family:     Physician:  Dr. Javier GlazierJohnalagadda 08/22/2013 10:27 AM   Nursing:   Harold Barbanonecia Byrd, RN 08/22/2013 10:27 AM   Clinical Social Worker:  Reyes Ivanhelsea Horton, LCSW 08/22/2013 10:27 AM   Other: Claudette Headonrad Withrow, PA 08/22/2013 10:27 AM   Other:  Sherrye PayorValerie Noch, care coordination 08/22/2013 10:27 AM   Other:  Juline PatchQuylle Hodnett, LCSW 08/22/2013 10:27 AM   Other:  Marzetta Boardhrista Dopson, RN 08/22/2013 10:27 AM   Other:    Other:    Other:    Other:    Other:      Scribe for Treatment Team:   Carmina MillerHorton, Dru Laurel Nicole, 08/22/2013 , 10:27 AM

## 2013-08-24 NOTE — Progress Notes (Signed)
Patient Discharge Instructions:  After Visit Summary (AVS):   Faxed to:  08/24/13 Psychiatric Admission Assessment Note:   Faxed to:  08/24/13 Suicide Risk Assessment - Discharge Assessment:   Faxed to:  08/24/13 Faxed/Sent to the Next Level Care provider:  08/24/13 Faxed to [facility] @ [phone]  Jerelene ReddenSheena E [place], 08/24/2013, 3:22 PM
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
## 2013-09-26 ENCOUNTER — Ambulatory Visit (INDEPENDENT_AMBULATORY_CARE_PROVIDER_SITE_OTHER): Payer: Federal, State, Local not specified - Other | Admitting: Advanced Practice Midwife

## 2013-09-26 ENCOUNTER — Encounter: Payer: Self-pay | Admitting: Advanced Practice Midwife

## 2013-09-26 VITALS — BP 113/66 | Wt 121.0 lb

## 2013-09-26 DIAGNOSIS — F329 Major depressive disorder, single episode, unspecified: Secondary | ICD-10-CM

## 2013-09-26 DIAGNOSIS — Z331 Pregnant state, incidental: Secondary | ICD-10-CM

## 2013-09-26 DIAGNOSIS — Z3480 Encounter for supervision of other normal pregnancy, unspecified trimester: Secondary | ICD-10-CM

## 2013-09-26 DIAGNOSIS — Z3490 Encounter for supervision of normal pregnancy, unspecified, unspecified trimester: Secondary | ICD-10-CM

## 2013-09-26 DIAGNOSIS — O99340 Other mental disorders complicating pregnancy, unspecified trimester: Secondary | ICD-10-CM

## 2013-09-26 LAB — HIV ANTIBODY (ROUTINE TESTING W REFLEX): HIV: NONREACTIVE
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
## 2013-09-26 NOTE — Progress Notes (Addendum)
   Subjective:    Shelby Bailey is a G2P1001 750w6d being seen today for her first obstetrical visit.  Her obstetrical history is significant for GDM in previous pregnancy, NSVD at term. Patient does not intend to breast feed but is open to discuss it, might consider initiating breastfeeding in hospital. Pregnancy history fully reviewed.  Patient reports no complaints.  Filed Vitals:   09/26/13 0937  BP: 113/66  Weight: 121 lb (54.885 kg)    HISTORY: OB History  Gravida Para Term Preterm AB SAB TAB Ectopic Multiple Living  2 1 1       1     # Outcome Date GA Lbr Len/2nd Weight Sex Delivery Anes PTL Lv  2 CUR           1 TRM 06/04/10 7375w0d   M SVD None N Y     Comments: gestational diabetes     Past Medical History  Diagnosis Date  . Headache(784.0)   . Gestational diabetes     Diet controlled  . Depression    Past Surgical History  Procedure Laterality Date  . No past surgeries     Family History  Problem Relation Age of Onset  . Diabetes Maternal Grandmother      Exam    Uterus:     Pelvic Exam:    Perineum: No Hemorrhoids, Normal Perineum   Vulva: normal   Vagina:  normal mucosa, normal discharge   pH:    Cervix: multiparous appearance, no bleeding following Pap, no cervical motion tenderness and no lesions   Adnexa: normal adnexa and no mass, fullness, tenderness   Bony Pelvis: average  System: Breast:  normal appearance, no masses or tenderness   Skin: normal coloration and turgor, no rashes    Neurologic: oriented, normal, gait normal; reflexes normal and symmetric   Extremities: normal strength, tone, and muscle mass, ROM of all joints is normal   HEENT neck supple with midline trachea and thyroid without masses   Mouth/Teeth mucous membranes moist, pharynx normal without lesions   Neck supple and no masses   Cardiovascular: regular rate and rhythm   Respiratory:  appears well, vitals normal, no respiratory distress, acyanotic, normal RR, ear and  throat exam is normal, neck free of mass or lymphadenopathy, chest clear, no wheezing, crepitations, rhonchi, normal symmetric air entry   Abdomen: soft, non-tender; bowel sounds normal; no masses,  no organomegaly   Urinary: urethral meatus normal      Assessment:    Pregnancy: G2P1001 Patient Active Problem List   Diagnosis Date Noted  . Supervision of normal subsequent pregnancy 09/26/2013  . Pregnant 08/17/2013  . Smoker 08/17/2013  . Marijuana use 08/17/2013  . Migraine 08/17/2013  . MDD (major depressive disorder) 08/17/2013        Plan:     Initial labs drawn. Prenatal vitamins. Problem list reviewed and updated. Genetic Screening discussed Quad Screen: ordered.  Ultrasound discussed; fetal survey: ordered.  Follow up in 4 weeks. 50% of 30 min visit spent on counseling and coordination of care.     LEFTWICH-KIRBY, Fiorela Pelzer 09/26/2013

## 2013-09-27 LAB — ALPHA FETOPROTEIN, MATERNAL
AFP: 80.7 [IU]/mL
Curr Gest Age: 20.6 wks.days
MOM FOR AFP: 1.18
Open Spina bifida: NEGATIVE

## 2013-09-27 LAB — OBSTETRIC PANEL
Antibody Screen: NEGATIVE
Basophils Absolute: 0 10*3/uL (ref 0.0–0.1)
Basophils Relative: 0 % (ref 0–1)
EOS ABS: 0.1 10*3/uL (ref 0.0–0.7)
Eosinophils Relative: 2 % (ref 0–5)
HCT: 30.8 % — ABNORMAL LOW (ref 36.0–46.0)
HEP B S AG: NEGATIVE
Hemoglobin: 10.6 g/dL — ABNORMAL LOW (ref 12.0–15.0)
Lymphocytes Relative: 24 % (ref 12–46)
Lymphs Abs: 1.5 10*3/uL (ref 0.7–4.0)
MCH: 30.1 pg (ref 26.0–34.0)
MCHC: 34.4 g/dL (ref 30.0–36.0)
MCV: 87.5 fL (ref 78.0–100.0)
Monocytes Absolute: 0.4 10*3/uL (ref 0.1–1.0)
Monocytes Relative: 6 % (ref 3–12)
NEUTROS PCT: 68 % (ref 43–77)
Neutro Abs: 4.3 10*3/uL (ref 1.7–7.7)
PLATELETS: 245 10*3/uL (ref 150–400)
RBC: 3.52 MIL/uL — ABNORMAL LOW (ref 3.87–5.11)
RDW: 14.2 % (ref 11.5–15.5)
Rh Type: POSITIVE
Rubella: 3.49 Index — ABNORMAL HIGH (ref <0.90)
WBC: 6.3 10*3/uL (ref 4.0–10.5)

## 2013-09-27 LAB — GLUCOSE TOLERANCE, 1 HOUR (50G) W/O FASTING: Glucose, 1 Hour GTT: 69 mg/dL — ABNORMAL LOW (ref 70–140)

## 2013-09-28 LAB — HEMOGLOBINOPATHY EVALUATION
HEMOGLOBIN OTHER: 0 %
HGB A2 QUANT: 2.7 % (ref 2.2–3.2)
Hgb A: 97.3 % (ref 96.8–97.8)
Hgb F Quant: 0 % (ref 0.0–2.0)
Hgb S Quant: 0 %

## 2013-10-01 ENCOUNTER — Encounter: Payer: Self-pay | Admitting: Advanced Practice Midwife

## 2013-10-04 ENCOUNTER — Ambulatory Visit (HOSPITAL_COMMUNITY)
Admission: RE | Admit: 2013-10-04 | Discharge: 2013-10-04 | Disposition: A | Discharge status: Home / Self Care | Payer: Medicaid Other | Source: Ambulatory Visit | Attending: Advanced Practice Midwife | Admitting: Advanced Practice Midwife

## 2013-10-04 DIAGNOSIS — O09299 Supervision of pregnancy with other poor reproductive or obstetric history, unspecified trimester: Secondary | ICD-10-CM | POA: Insufficient documentation

## 2013-10-04 DIAGNOSIS — Z3480 Encounter for supervision of other normal pregnancy, unspecified trimester: Secondary | ICD-10-CM

## 2013-10-04 DIAGNOSIS — Z3689 Encounter for other specified antenatal screening: Secondary | ICD-10-CM | POA: Insufficient documentation

## 2013-10-10 ENCOUNTER — Encounter: Payer: Self-pay | Admitting: *Deleted

## 2013-10-10 DIAGNOSIS — O99340 Other mental disorders complicating pregnancy, unspecified trimester: Secondary | ICD-10-CM | POA: Insufficient documentation

## 2013-10-20 NOTE — Addendum Note (Signed)
Encounter addended by: Hurshel PartyLisa A Leftwich-Kirby, CNM on: 10/20/2013  4:22 AM<BR>     Documentation filed: Problem List

## 2013-10-24 ENCOUNTER — Encounter: Payer: Self-pay | Admitting: Advanced Practice Midwife

## 2013-10-30 ENCOUNTER — Telehealth: Payer: Self-pay

## 2013-10-30 NOTE — Telephone Encounter (Signed)
Pt. Called requesting lab results from last visit. States she can be reached at [phone]. Called pt. And informed her of results as well as her appointment this Friday at 1015. Pt. Verbalized understanding. No other questions or concerns.

## 2013-11-02 ENCOUNTER — Encounter: Payer: Self-pay | Admitting: Advanced Practice Midwife

## 2013-11-23 ENCOUNTER — Encounter: Payer: Self-pay | Admitting: General Practice

## 2013-12-27 ENCOUNTER — Ambulatory Visit (INDEPENDENT_AMBULATORY_CARE_PROVIDER_SITE_OTHER): Payer: Medicaid Other | Admitting: Family Medicine

## 2013-12-27 ENCOUNTER — Encounter: Payer: Self-pay | Admitting: Family Medicine

## 2013-12-27 VITALS — BP 116/68 | HR 96 | Wt 140.1 lb

## 2013-12-27 DIAGNOSIS — Z3483 Encounter for supervision of other normal pregnancy, third trimester: Secondary | ICD-10-CM

## 2013-12-27 DIAGNOSIS — Z3480 Encounter for supervision of other normal pregnancy, unspecified trimester: Secondary | ICD-10-CM

## 2013-12-27 DIAGNOSIS — Z23 Encounter for immunization: Secondary | ICD-10-CM

## 2013-12-27 LAB — POCT URINALYSIS DIP (DEVICE)
Bilirubin Urine: NEGATIVE
Glucose, UA: NEGATIVE mg/dL
Hgb urine dipstick: NEGATIVE
Ketones, ur: NEGATIVE mg/dL
LEUKOCYTES UA: NEGATIVE
NITRITE: NEGATIVE
PROTEIN: NEGATIVE mg/dL
Specific Gravity, Urine: 1.01 (ref 1.005–1.030)
Urobilinogen, UA: 0.2 mg/dL (ref 0.0–1.0)
pH: 6 (ref 5.0–8.0)

## 2013-12-27 LAB — CBC
HEMATOCRIT: 29.3 % — AB (ref 36.0–46.0)
Hemoglobin: 10.4 g/dL — ABNORMAL LOW (ref 12.0–15.0)
MCH: 30.7 pg (ref 26.0–34.0)
MCHC: 35.5 g/dL (ref 30.0–36.0)
MCV: 86.4 fL (ref 78.0–100.0)
Platelets: 238 10*3/uL (ref 150–400)
RBC: 3.39 MIL/uL — ABNORMAL LOW (ref 3.87–5.11)
RDW: 13.1 % (ref 11.5–15.5)
WBC: 8.8 10*3/uL (ref 4.0–10.5)

## 2013-12-27 MED ORDER — PRENATAL VITAMINS 28-0.8 MG PO TABS: 1.0000 | ORAL_TABLET | Freq: Every day | ORAL | Status: AC

## 2013-12-27 NOTE — Progress Notes (Signed)
+  FM, no lof, no vb, occ ctx - couple times a day. Reports backpain as well.  No other complaints Concern for missed appt regarding "transportation" and pt reporst plan to tranfer to [place]. Recommend establishing care immediately upon arrival. Pt states its for "social Reasons" and will not go into more detail.   Shelby Bailey is a 23 y.o. G2P1001 at 4347w0d here for ROB visit.  Discussed with Patient:  -Plans to breast feed.  All questions answered. -Continue prenatal vitamins. -Reviewed fetal kick counts Pt to perform daily at a time when the baby is active, lie laterally with both hands on belly in quiet room and count all movements (hiccups, shoulder rolls, obvious kicks, etc); pt is to report to clinic L&D for less than 10 movements felt in a one hour time period-pt told as soon as she counts 10 movements the count is complete.  - Routine precautions discussed (depression, infection s/s).   Patient provided with all pertinent phone numbers for emergencies. - RTC for any VB, regular, painful cramps/ctxs occurring at a rate of >2/10 min, fever (100.5 or higher), n/v/d, any pain that is unresolving or worsening, LOF, decreased fetal movement, CP, SOB, edema - RTC in 2 weeks for next appt.  Problems: Patient Active Problem List   Diagnosis Date Noted  . Mental disorders of mother, antepartum 10/10/2013  . Supervision of normal subsequent pregnancy 09/26/2013  . Pregnant 08/17/2013  . Smoker 08/17/2013  . Marijuana use 08/17/2013  . Migraine 08/17/2013  . MDD (major depressive disorder) 08/17/2013    To Do: 1. Tdap will be given today  [ ]  Vaccines: Flu:dec  Tdap: today [x ] BCM: mirena [ ]  Readiness: baby has a place to sleep, car seat, other baby necessities.  Edu: [x ] PTL precautions; [ ]  BF class; [ ]  childbirth class; [ ]   BF counseling;

## 2013-12-27 NOTE — Progress Notes (Signed)
28 week labs today.  

## 2013-12-27 NOTE — Patient Instructions (Signed)
Third Trimester of Pregnancy The third trimester is from week 29 through week 42, months 7 through 9. The third trimester is a time when the fetus is growing rapidly. At the end of the ninth month, the fetus is about 20 inches in length and weighs 6-10 pounds.  BODY CHANGES Your body goes through many changes during pregnancy. The changes vary from woman to woman.   Your weight will continue to increase. You can expect to gain 25-35 pounds (11-16 kg) by the end of the pregnancy.  You may begin to get stretch marks on your hips, abdomen, and breasts.  You may urinate more often because the fetus is moving lower into your pelvis and pressing on your bladder.  You may develop or continue to have heartburn as a result of your pregnancy.  You may develop constipation because certain hormones are causing the muscles that push waste through your intestines to slow down.  You may develop hemorrhoids or swollen, bulging veins (varicose veins).  You may have pelvic pain because of the weight gain and pregnancy hormones relaxing your joints between the bones in your pelvis. Backaches may result from overexertion of the muscles supporting your posture.  You may have changes in your hair. These can include thickening of your hair, rapid growth, and changes in texture. Some women also have hair loss during or after pregnancy, or hair that feels dry or thin. Your hair will most likely return to normal after your baby is born.  Your breasts will continue to grow and be tender. A yellow discharge may leak from your breasts called colostrum.  Your belly button may stick out.  You may feel short of breath because of your expanding uterus.  You may notice the fetus "dropping," or moving lower in your abdomen.  You may have a bloody mucus discharge. This usually occurs a few days to a week before labor begins.  Your cervix becomes thin and soft (effaced) near your due date. WHAT TO EXPECT AT YOUR PRENATAL  EXAMS  You will have prenatal exams every 2 weeks until week 36. Then, you will have weekly prenatal exams. During a routine prenatal visit:  You will be weighed to make sure you and the fetus are growing normally.  Your blood pressure is taken.  Your abdomen will be measured to track your baby's growth.  The fetal heartbeat will be listened to.  Any test results from the previous visit will be discussed.  You may have a cervical check near your due date to see if you have effaced. At around 36 weeks, your caregiver will check your cervix. At the same time, your caregiver will also perform a test on the secretions of the vaginal tissue. This test is to determine if a type of bacteria, Group B streptococcus, is present. Your caregiver will explain this further. Your caregiver may ask you:  What your birth plan is.  How you are feeling.  If you are feeling the baby move.  If you have had any abnormal symptoms, such as leaking fluid, bleeding, severe headaches, or abdominal cramping.  If you have any questions. Other tests or screenings that may be performed during your third trimester include:  Blood tests that check for low iron levels (anemia).  Fetal testing to check the health, activity level, and growth of the fetus. Testing is done if you have certain medical conditions or if there are problems during the pregnancy. FALSE LABOR You may feel small, irregular contractions that   eventually go away. These are called Braxton Hicks contractions, or false labor. Contractions may last for hours, days, or even weeks before true labor sets in. If contractions come at regular intervals, intensify, or become painful, it is best to be seen by your caregiver.  SIGNS OF LABOR   Menstrual-like cramps.  Contractions that are 5 minutes apart or less.  Contractions that start on the top of the uterus and spread down to the lower abdomen and back.  A sense of increased pelvic pressure or back  pain.  A watery or bloody mucus discharge that comes from the vagina. If you have any of these signs before the 37th week of pregnancy, call your caregiver right away. You need to go to the hospital to get checked immediately. HOME CARE INSTRUCTIONS   Avoid all smoking, herbs, alcohol, and unprescribed drugs. These chemicals affect the formation and growth of the baby.  Follow your caregiver's instructions regarding medicine use. There are medicines that are either safe or unsafe to take during pregnancy.  Exercise only as directed by your caregiver. Experiencing uterine cramps is a good sign to stop exercising.  Continue to eat regular, healthy meals.  Wear a good support bra for breast tenderness.  Do not use hot tubs, steam rooms, or saunas.  Wear your seat belt at all times when driving.  Avoid raw meat, uncooked cheese, cat litter boxes, and soil used by cats. These carry germs that can cause birth defects in the baby.  Take your prenatal vitamins.  Try taking a stool softener (if your caregiver approves) if you develop constipation. Eat more high-fiber foods, such as fresh vegetables or fruit and whole grains. Drink plenty of fluids to keep your urine clear or pale yellow.  Take warm sitz baths to soothe any pain or discomfort caused by hemorrhoids. Use hemorrhoid cream if your caregiver approves.  If you develop varicose veins, wear support hose. Elevate your feet for 15 minutes, 3-4 times a day. Limit salt in your diet.  Avoid heavy lifting, wear low heal shoes, and practice good posture.  Rest a lot with your legs elevated if you have leg cramps or low back pain.  Visit your dentist if you have not gone during your pregnancy. Use a soft toothbrush to brush your teeth and be gentle when you floss.  A sexual relationship may be continued unless your caregiver directs you otherwise.  Do not travel far distances unless it is absolutely necessary and only with the approval  of your caregiver.  Take prenatal classes to understand, practice, and ask questions about the labor and delivery.  Make a trial run to the hospital.  Pack your hospital bag.  Prepare the baby's nursery.  Continue to go to all your prenatal visits as directed by your caregiver. SEEK MEDICAL CARE IF:  You are unsure if you are in labor or if your water has broken.  You have dizziness.  You have mild pelvic cramps, pelvic pressure, or nagging pain in your abdominal area.  You have persistent nausea, vomiting, or diarrhea.  You have a bad smelling vaginal discharge.  You have pain with urination. SEEK IMMEDIATE MEDICAL CARE IF:   You have a fever.  You are leaking fluid from your vagina.  You have spotting or bleeding from your vagina.  You have severe abdominal cramping or pain.  You have rapid weight loss or gain.  You have shortness of breath with chest pain.  You notice sudden or extreme swelling   of your face, hands, ankles, feet, or legs.  You have not felt your baby move in over an hour.  You have severe headaches that do not go away with medicine.  You have vision changes. Document Released: 06/08/2001 Document Revised: 06/19/2013 Document Reviewed: 08/15/2012 Exit[employer] Patient Information 2015 [employer]. This information is not intended to replace advice given to you by your health care provider. Make sure you discuss any questions you have with your health care provider.  

## 2013-12-28 LAB — RPR

## 2013-12-28 LAB — HIV ANTIBODY (ROUTINE TESTING W REFLEX): HIV: NONREACTIVE

## 2013-12-28 LAB — GLUCOSE TOLERANCE, 1 HOUR (50G) W/O FASTING: GLUCOSE 1 HOUR GTT: 77 mg/dL (ref 70–140)

## 2014-04-29 ENCOUNTER — Encounter: Payer: Self-pay | Admitting: Family Medicine

## 2014-06-22 ENCOUNTER — Encounter (HOSPITAL_COMMUNITY): Payer: Self-pay | Admitting: *Deleted

## 2015-08-28 ENCOUNTER — Encounter (HOSPITAL_COMMUNITY): Payer: Self-pay | Admitting: *Deleted

## 2015-08-28 ENCOUNTER — Inpatient Hospital Stay (HOSPITAL_COMMUNITY)
Admission: AD | Admit: 2015-08-28 | Discharge: 2015-08-28 | Disposition: A | Discharge status: Home / Self Care | Payer: Medicaid Other | Source: Ambulatory Visit | Attending: Obstetrics & Gynecology | Admitting: Obstetrics & Gynecology

## 2015-08-28 DIAGNOSIS — F17210 Nicotine dependence, cigarettes, uncomplicated: Secondary | ICD-10-CM | POA: Insufficient documentation

## 2015-08-28 DIAGNOSIS — O99332 Smoking (tobacco) complicating pregnancy, second trimester: Secondary | ICD-10-CM | POA: Insufficient documentation

## 2015-08-28 DIAGNOSIS — Z3A15 15 weeks gestation of pregnancy: Secondary | ICD-10-CM | POA: Insufficient documentation

## 2015-08-28 DIAGNOSIS — O23592 Infection of other part of genital tract in pregnancy, second trimester: Secondary | ICD-10-CM | POA: Insufficient documentation

## 2015-08-28 DIAGNOSIS — N760 Acute vaginitis: Secondary | ICD-10-CM | POA: Diagnosis not present

## 2015-08-28 DIAGNOSIS — B9689 Other specified bacterial agents as the cause of diseases classified elsewhere: Secondary | ICD-10-CM

## 2015-08-28 DIAGNOSIS — A499 Bacterial infection, unspecified: Secondary | ICD-10-CM

## 2015-08-28 LAB — WET PREP, GENITAL
Sperm: NONE SEEN
Trich, Wet Prep: NONE SEEN
Yeast Wet Prep HPF POC: NONE SEEN

## 2015-08-28 LAB — URINALYSIS, ROUTINE W REFLEX MICROSCOPIC
Bilirubin Urine: NEGATIVE
Glucose, UA: NEGATIVE mg/dL
Hgb urine dipstick: NEGATIVE
Ketones, ur: NEGATIVE mg/dL
LEUKOCYTES UA: NEGATIVE
Nitrite: NEGATIVE
PH: 6.5 (ref 5.0–8.0)
Protein, ur: NEGATIVE mg/dL
SPECIFIC GRAVITY, URINE: 1.025 (ref 1.005–1.030)

## 2015-08-28 MED ORDER — METRONIDAZOLE 500 MG PO TABS: 500.0000 mg | ORAL_TABLET | Freq: Two times a day (BID) | ORAL | Status: AC

## 2015-08-28 NOTE — MAU Provider Note (Signed)
History     CSN: [id number]  Arrival date and time: 08/28/15 1402   First Provider Initiated Contact with Patient 08/28/15 1450      No chief complaint on file.  HPI   [name] is a 25 y.o. female G3P2002 at [redacted]w[redacted]d presenting to MAU with vaginal bleeding that she noted yesterday and decreased fetal movement. She denies vaginal bleeding now.  The bleeding was very brief, she has noticed an increase in white vaginal discharge with mild odor.  She is here from out of town; plans to go back home in 1-2 weeks.   No prenatal care due to insurance.   OB History    Gravida Para Term Preterm AB TAB SAB Ectopic Multiple Living   Past Medical History  Diagnosis Date  . Headache(784.0)   . Gestational diabetes     Diet controlled  . Depression     Past Surgical History  Procedure Laterality Date  . No past surgeries      Family History  Problem Relation Age of Onset  . Diabetes Maternal Grandmother     Social History  Substance Use Topics  . Smoking status: Current Some Day Smoker -- 0.10 packs/day    Types: Cigarettes  . Smokeless tobacco: Never Used  . Alcohol Use: No    Allergies: No Known Allergies  Prescriptions prior to admission  Medication Sig Dispense Refill Last Dose  . Prenatal Vit-Fe Fumarate-FA (PRENATAL VITAMINS) 28-0.8 MG TABS Take 1 tablet by mouth daily. 60 tablet 1 08/27/2015 at Unknown time   Results for orders placed or performed during the hospital encounter of 08/28/15 (from the past 48 hour(s))  Urinalysis, Routine w reflex microscopic (not at [facility])     Status: None   Collection Time: 08/28/15  2:15 PM  Result Value Ref Range   Color, Urine YELLOW YELLOW   APPearance CLEAR CLEAR   Specific Gravity, Urine 1.025 1.005 - 1.030   pH 6.5 5.0 - 8.0   Glucose, UA NEGATIVE NEGATIVE mg/dL   Hgb urine dipstick NEGATIVE NEGATIVE   Bilirubin Urine NEGATIVE NEGATIVE   Ketones, ur NEGATIVE NEGATIVE mg/dL   Protein, ur NEGATIVE  NEGATIVE mg/dL   Nitrite NEGATIVE NEGATIVE   Leukocytes, UA NEGATIVE NEGATIVE    Comment: MICROSCOPIC NOT DONE ON URINES WITH NEGATIVE PROTEIN, BLOOD, LEUKOCYTES, NITRITE, OR GLUCOSE <1000 mg/dL.  Wet prep, genital     Status: Abnormal   Collection Time: 08/28/15  2:55 PM  Result Value Ref Range   Yeast Wet Prep HPF POC NONE SEEN NONE SEEN   Trich, Wet Prep NONE SEEN NONE SEEN   Clue Cells Wet Prep HPF POC PRESENT (A) NONE SEEN   WBC, Wet Prep HPF POC MODERATE (A) NONE SEEN    Comment: MANY BACTERIA SEEN   Sperm NONE SEEN     Review of Systems  Constitutional: Negative for fever and chills.  Gastrointestinal: Negative for abdominal pain.  Genitourinary: Negative for dysuria and urgency.   Physical Exam   Blood pressure 120/64, pulse 98, temperature 98.7 F (37.1 C), temperature source Oral, resp. rate 18, last menstrual period 05/14/2015, SpO2 100 %, unknown if currently breastfeeding.  Physical Exam  Constitutional: She is oriented to person, place, and time. She appears well-developed and well-nourished. No distress.  HENT:  Head: Normocephalic.  Eyes: Pupils are equal, round, and reactive to light.  Respiratory: Effort normal.  GI: Soft. She exhibits  no distension. There is no tenderness. There is no rebound and no guarding.  Genitourinary:  Speculum exam: Vagina - Small-moderate amount of creamy white discharge, mild odor Cervix - No contact bleeding Bimanual exam: Cervix closed Uterus non tender, gravid  Adnexa non tender, no masses bilaterally GC/Chlam, wet prep done Chaperone present for exam.  Musculoskeletal: Normal range of motion.  Neurological: She is alert and oriented to person, place, and time.  Skin: Skin is warm. She is not diaphoretic.  Psychiatric: Her behavior is normal.    MAU Course  Procedures  None  MDM + fetal heart tones.   Assessment and Plan   A:  1. BV (bacterial vaginosis)     P:  Discharge home in stable condition RX:  Flagyl  Follow up with your OBGYN once you return home Return to MAU for emergencies.   Duane Lope, NP 08/28/2015 4:29 PM

## 2015-08-28 NOTE — Discharge Instructions (Signed)

## 2015-08-28 NOTE — MAU Note (Addendum)
Pt states she was feeling baby move but yesterday and today hasn't felt any.  Pt states she started having vaginal bleeding yesterday morning like a period.  Increase in vaginal discharge with odor.  Pt is visiting from [place] due to Grandmother being very sick (end of life) Pt reports no PNC started due to presumptive medicaid.  Not sure when she is due or even LMP except for sometime in Nov.

## 2015-08-29 LAB — GC/CHLAMYDIA PROBE AMP (~~LOC~~) NOT AT ARMC
CHLAMYDIA, DNA PROBE: NEGATIVE
NEISSERIA GONORRHEA: NEGATIVE

## 2016-01-27 IMAGING — US US OB COMP +14 WK
1 series · 12 of 28 positions shown · non-contrast
Comparison: none

[Series 1: us ob comp +14 wk · 12 of 82 slices shown]
[im 4/82]
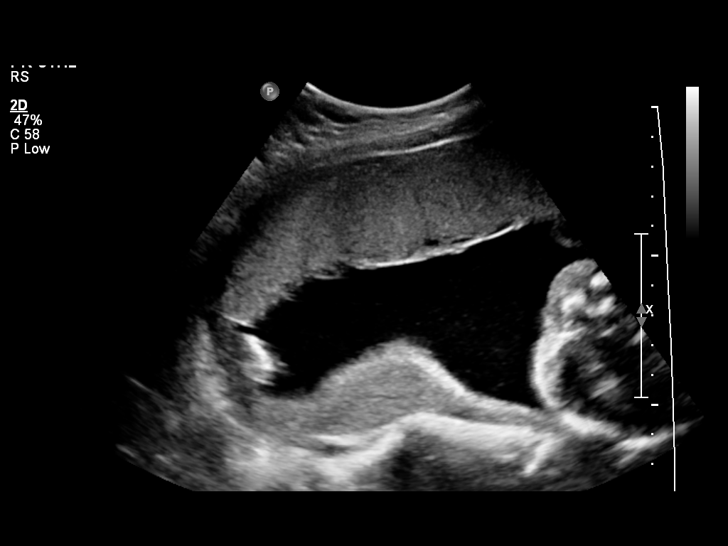
[im 10/82]
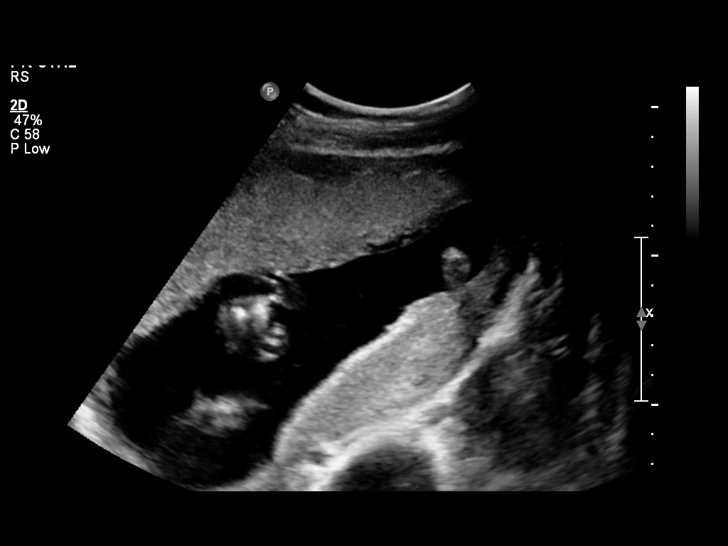
[im 16/82]
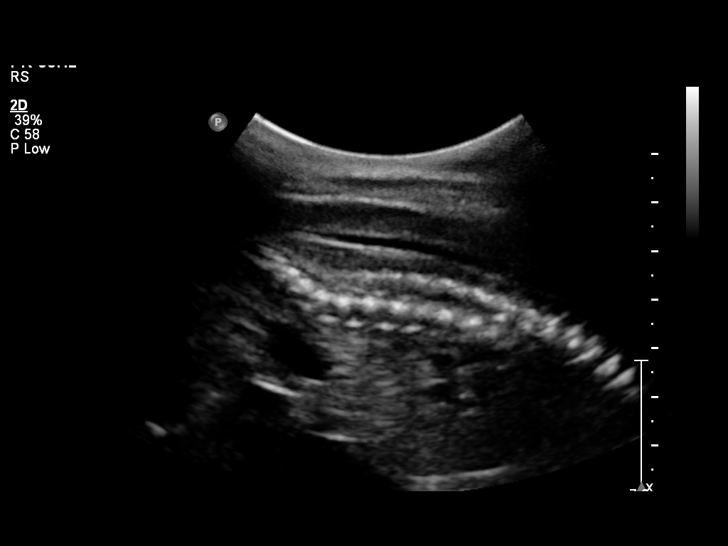
[im 25/82]
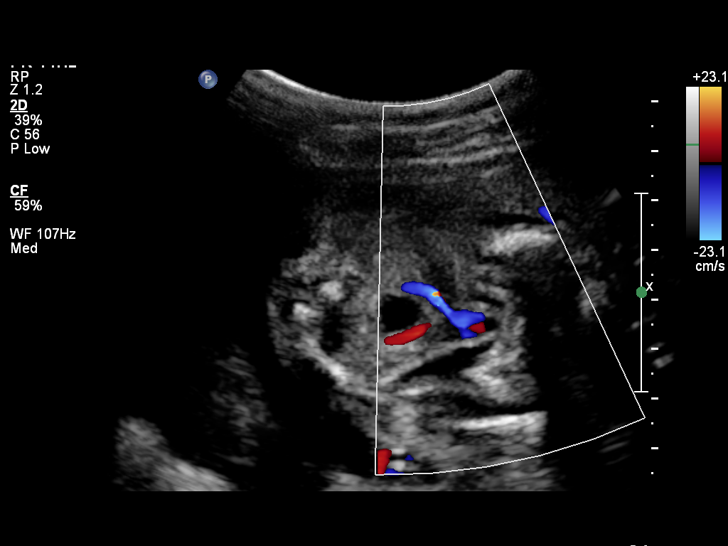
[im 31/82]
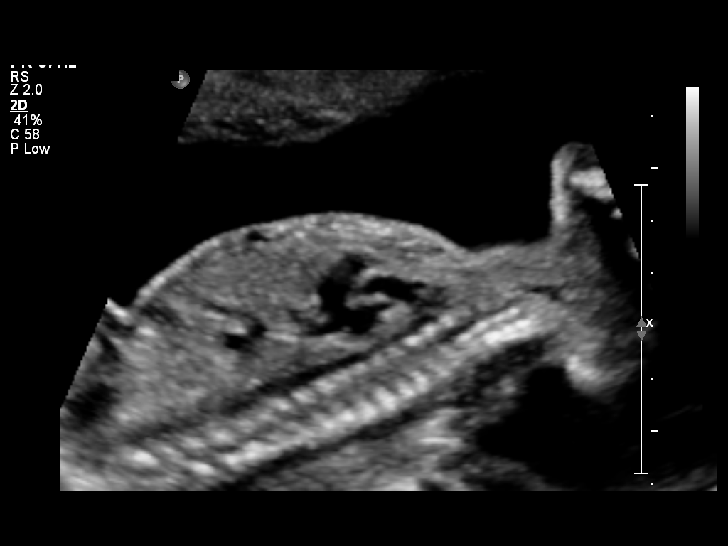
[im 37/82]
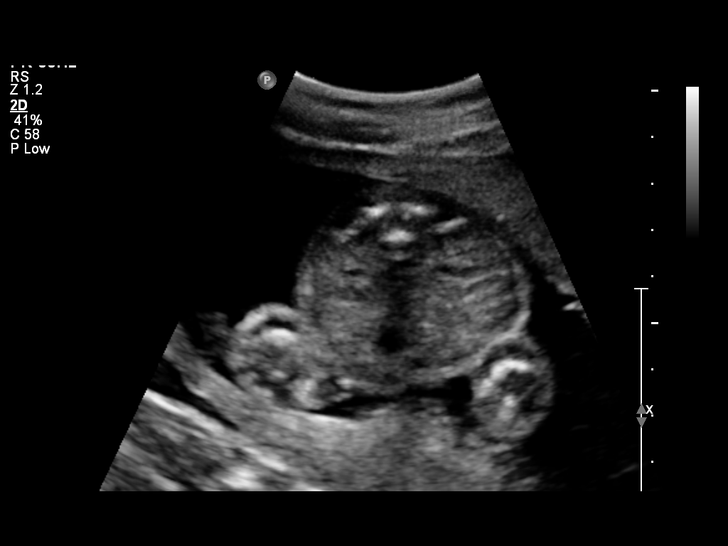
[im 46/82]
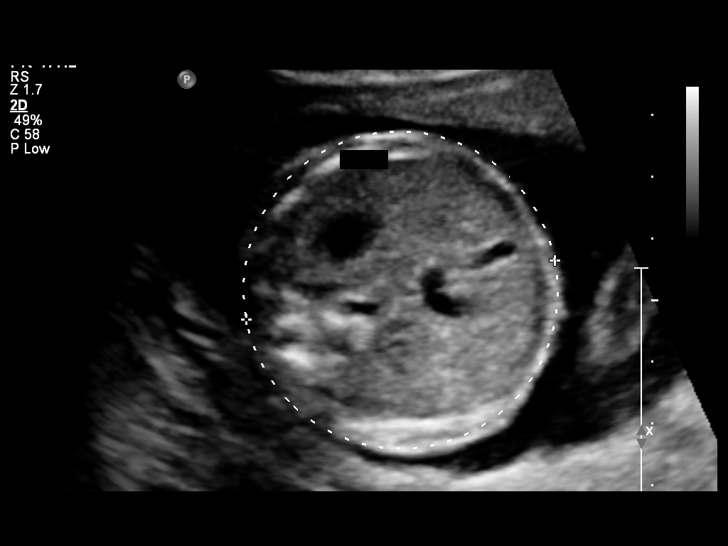
[im 52/82]
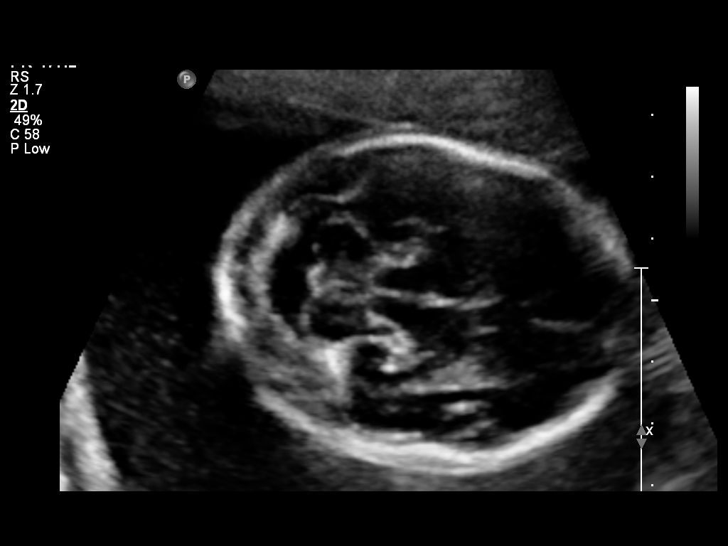
[im 58/82]
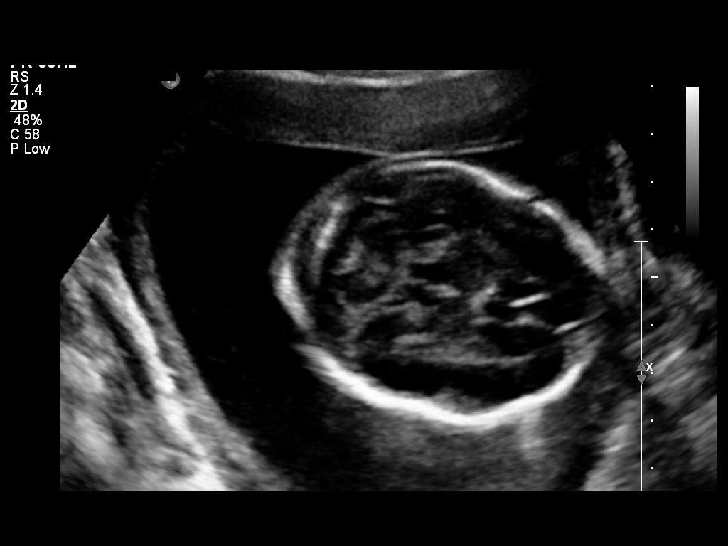
[im 67/82]
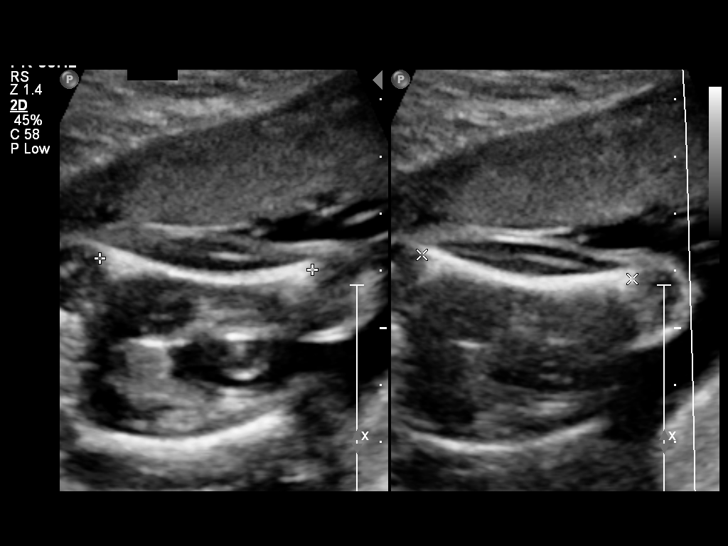
[im 73/82]
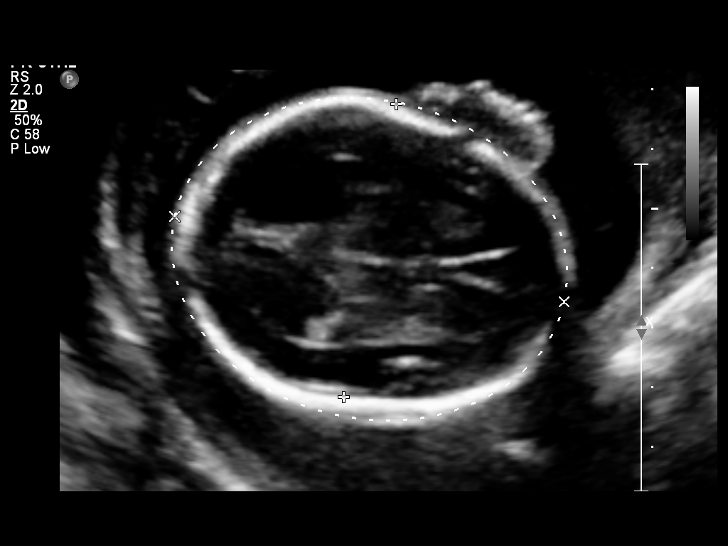
[im 79/82]
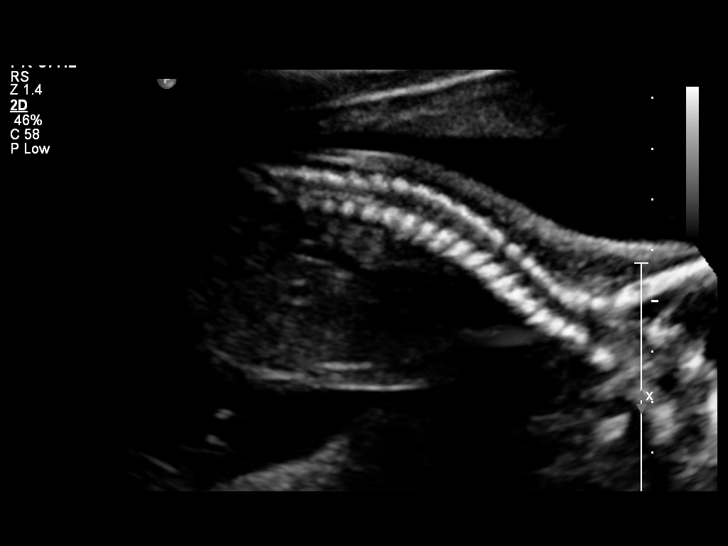

[12 of 28 positions shown; findings below may reference images not displayed]

OBSTETRICS REPORT
                      (Signed Final 10/05/2013 [DATE])

                                                         CNM
Service(s) Provided

 US OB COMP + 14 WK                                    76805.1
Indications

 Basic anatomic survey
 Poor obstetric history: Previous gestational
 diabetes
Fetal Evaluation

 Num Of Fetuses:    1
 Fetal Heart Rate:  163                          bpm
 Cardiac Activity:  Observed
 Presentation:      Cephalic
 Placenta:          Anterior, above cervical os
 P. Cord            Visualized, central
 Insertion:

 Amniotic Fluid
 AFI FV:      Subjectively within normal limits
                                             Larg Pckt:    6.13  cm
Biometry

 BPD:     50.1  mm     G. Age:  21w 1d                CI:        70.14   70 - 86
                                                      FL/HC:      19.4   18.4 -

 HC:     190.8  mm     G. Age:  21w 3d       16  %    HC/AC:      1.17   1.06 -

 AC:     162.6  mm     G. Age:  21w 3d       23  %    FL/BPD:     73.9   71 - 87
 FL:        37  mm     G. Age:  21w 6d       33  %    FL/AC:      22.8   20 - 24
 HUM:     34.3  mm     G. Age:  21w 5d       42  %
 CER:     21.9  mm     G. Age:  20w 6d       19  %

 Est. FW:     429  gm    0 lb 15 oz      32  %
Gestational Age

 LMP:           22w 0d        Date:  05/03/13                 EDD:   02/07/14
 U/S Today:     21w 3d                                        EDD:   02/11/14
 Best:          22w 0d     Det. By:  LMP  (05/03/13)          EDD:   02/07/14
Anatomy
 Cranium:          Appears normal         Aortic Arch:      Appears normal
 Fetal Cavum:      Appears normal         Ductal Arch:      Appears normal
 Ventricles:       Appears normal         Diaphragm:        Appears normal
 Choroid Plexus:   Appears normal         Stomach:          Appears normal
 Cerebellum:       Appears normal         Abdomen:          Appears normal
 Posterior Fossa:  Appears normal         Abdominal Wall:   Appears nml (cord
                                                            insert, abd wall)
 Nuchal Fold:      Appears normal         Cord Vessels:     Appears normal (3
                                                            vessel cord)
 Face:             Appears normal         Kidneys:          Appear normal
                   (orbits and profile)
 Lips:             Appears normal         Bladder:          Appears normal
 Heart:            Appears normal         Spine:            Appears normal
                   (EIF)
 RVOT:             Appears normal         Lower             Appears normal
                                          Extremities:
 LVOT:             Appears normal         Upper             Appears normal
                                          Extremities:

 Other:  Male gender. Heels and 5th digit visualized.
Targeted Anatomy

 Fetal Central Nervous System
 Cisterna Magna:
Cervix Uterus Adnexa

 Cervical Length:    3.04     cm

 Cervix:       Normal appearance by transabdominal scan.
 Uterus:       No abnormality visualized.
 Cul De Sac:   No free fluid seen.
 Left Ovary:    Within normal limits.
 Right Ovary:   Not visualized.

 Adnexa:     No abnormality visualized.
Impression

 SIUP at 22+0 weeks
 Normal detailed fetal anatomy
 Markers of aneuploidy: EIF - not thought to increase DSR in a
 low risk population
 Normal amniotic fluid volume
 Measurements consistent with LMP dating
Recommendations

 Follow-up as clinically indicated

 questions or concerns.

## 2016-07-02 ENCOUNTER — Encounter (HOSPITAL_COMMUNITY): Payer: Self-pay
